# Patient Record
Sex: Male | Born: 1965 | Race: Black or African American | Hispanic: No | Smoking: Never smoker
Health system: Southern US, Community
[De-identification: ages and names within clinical notes are randomized; demographics above are authoritative.]

## PROBLEM LIST (undated history)

## (undated) DIAGNOSIS — L0390 Cellulitis, unspecified: Secondary | ICD-10-CM

## (undated) HISTORY — PX: JOINT REPLACEMENT: SHX530

## (undated) HISTORY — PX: ABDOMINAL SURGERY: SHX537

---

## 2011-10-13 ENCOUNTER — Encounter (HOSPITAL_COMMUNITY): Payer: Self-pay | Admitting: Emergency Medicine

## 2011-10-13 ENCOUNTER — Emergency Department (HOSPITAL_COMMUNITY)
Admission: EM | Admit: 2011-10-13 | Discharge: 2011-10-13 | Payer: Self-pay | Attending: Emergency Medicine | Admitting: Emergency Medicine

## 2011-10-13 ENCOUNTER — Emergency Department (HOSPITAL_COMMUNITY): Payer: Self-pay

## 2011-10-13 DIAGNOSIS — S7000XA Contusion of unspecified hip, initial encounter: Secondary | ICD-10-CM | POA: Insufficient documentation

## 2011-10-13 DIAGNOSIS — S0990XA Unspecified injury of head, initial encounter: Secondary | ICD-10-CM | POA: Insufficient documentation

## 2011-10-13 DIAGNOSIS — M79609 Pain in unspecified limb: Secondary | ICD-10-CM | POA: Insufficient documentation

## 2011-10-13 DIAGNOSIS — S7001XA Contusion of right hip, initial encounter: Secondary | ICD-10-CM
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
## 2016-04-13 DIAGNOSIS — L0390 Cellulitis, unspecified: Secondary | ICD-10-CM

## 2016-04-13 HISTORY — DX: Cellulitis, unspecified: L03.90

## 2016-04-23 ENCOUNTER — Inpatient Hospital Stay (HOSPITAL_COMMUNITY): Payer: Commercial Managed Care - PPO

## 2016-04-23 ENCOUNTER — Encounter (HOSPITAL_COMMUNITY): Payer: Self-pay | Admitting: *Deleted

## 2016-04-23 ENCOUNTER — Inpatient Hospital Stay (HOSPITAL_COMMUNITY)
Admission: EM | Admit: 2016-04-23 | Discharge: 2016-04-27 | DRG: 501 | Disposition: A | Discharge status: Home / Self Care | Payer: Commercial Managed Care - PPO | Attending: Family Medicine | Admitting: Family Medicine

## 2016-04-23 DIAGNOSIS — N179 Acute kidney failure, unspecified: Secondary | ICD-10-CM | POA: Diagnosis present

## 2016-04-23 DIAGNOSIS — M7989 Other specified soft tissue disorders: Secondary | ICD-10-CM | POA: Diagnosis not present

## 2016-04-23 DIAGNOSIS — Z87891 Personal history of nicotine dependence: Secondary | ICD-10-CM | POA: Diagnosis not present

## 2016-04-23 DIAGNOSIS — Z96659 Presence of unspecified artificial knee joint: Secondary | ICD-10-CM | POA: Diagnosis present

## 2016-04-23 DIAGNOSIS — M71121 Other infective bursitis, right elbow: Secondary | ICD-10-CM | POA: Diagnosis not present

## 2016-04-23 DIAGNOSIS — L0390 Cellulitis, unspecified: Secondary | ICD-10-CM

## 2016-04-23 DIAGNOSIS — Z8249 Family history of ischemic heart disease and other diseases of the circulatory system: Secondary | ICD-10-CM

## 2016-04-23 DIAGNOSIS — L03113 Cellulitis of right upper limb: Secondary | ICD-10-CM | POA: Diagnosis not present

## 2016-04-23 HISTORY — DX: Cellulitis, unspecified: L03.90

## 2016-04-23 LAB — CBC WITH DIFFERENTIAL/PLATELET
Basophils Absolute: 0 10*3/uL (ref 0.0–0.1)
Basophils Relative: 0 %
Eosinophils Absolute: 0.2 10*3/uL (ref 0.0–0.7)
Eosinophils Relative: 2 %
HEMATOCRIT: 38 % — AB (ref 39.0–52.0)
HEMOGLOBIN: 12.8 g/dL — AB (ref 13.0–17.0)
LYMPHS ABS: 2.2 10*3/uL (ref 0.7–4.0)
LYMPHS PCT: 23 %
MCH: 31 pg (ref 26.0–34.0)
MCHC: 33.7 g/dL (ref 30.0–36.0)
MCV: 92 fL (ref 78.0–100.0)
Monocytes Absolute: 0.8 10*3/uL (ref 0.1–1.0)
Monocytes Relative: 8 %
NEUTROS ABS: 6.2 10*3/uL (ref 1.7–7.7)
NEUTROS PCT: 67 %
Platelets: 238 10*3/uL (ref 150–400)
RBC: 4.13 MIL/uL — AB (ref 4.22–5.81)
RDW: 14.6 % (ref 11.5–15.5)
WBC: 9.4 10*3/uL (ref 4.0–10.5)

## 2016-04-23 LAB — BASIC METABOLIC PANEL
Anion gap: 10 (ref 5–15)
BUN: 12 mg/dL (ref 6–20)
CHLORIDE: 103 mmol/L (ref 101–111)
CO2: 27 mmol/L (ref 22–32)
Calcium: 9 mg/dL (ref 8.9–10.3)
Creatinine, Ser: 1.25 mg/dL — ABNORMAL HIGH (ref 0.61–1.24)
GFR calc non Af Amer: >60 mL/min (ref >60)
Glucose, Bld: 95 mg/dL (ref 65–99)
POTASSIUM: 4 mmol/L (ref 3.5–5.1)
SODIUM: 140 mmol/L (ref 135–145)

## 2016-04-23 LAB — SEDIMENTATION RATE: Sed Rate: 48 mm/hr — ABNORMAL HIGH (ref 0–16)

## 2016-04-23 LAB — C-REACTIVE PROTEIN: CRP: 6.9 mg/dL — AB (ref <1.0)

## 2016-04-23 MED ORDER — OXYCODONE-ACETAMINOPHEN 5-325 MG PO TABS
1.0000 | ORAL_TABLET | ORAL | Status: DC | PRN
  Administered 2016-04-23: 1 via ORAL

## 2016-04-23 MED ORDER — VANCOMYCIN HCL 10 G IV SOLR
1500.0000 mg | INTRAVENOUS | Status: AC
  Administered 2016-04-23: 1500 mg via INTRAVENOUS
  Filled 2016-04-23: qty 1500

## 2016-04-23 MED ORDER — SODIUM CHLORIDE 0.9 % IV SOLN
INTRAVENOUS | Status: DC
Start: 2016-04-23 — End: 2016-04-24
  Administered 2016-04-23: 18:00:00 via INTRAVENOUS

## 2016-04-23 MED ORDER — ACETAMINOPHEN 325 MG PO TABS: 650.0000 mg | ORAL_TABLET | Freq: Four times a day (QID) | ORAL | Status: DC | PRN

## 2016-04-23 MED ORDER — HYDROCODONE-ACETAMINOPHEN 5-325 MG PO TABS
1.0000 | ORAL_TABLET | ORAL | Status: DC | PRN
  Administered 2016-04-23 – 2016-04-24 (×5): 1 via ORAL
  Filled 2016-04-23 (×5): qty 1

## 2016-04-23 MED ORDER — VANCOMYCIN HCL 10 G IV SOLR
1250.0000 mg | Freq: Two times a day (BID) | INTRAVENOUS | Status: DC
  Administered 2016-04-24 – 2016-04-26 (×5): 1250 mg via INTRAVENOUS
  Filled 2016-04-23 (×7): qty 1250

## 2016-04-23 MED ORDER — ENOXAPARIN SODIUM 40 MG/0.4ML ~~LOC~~ SOLN
40.0000 mg | SUBCUTANEOUS | Status: DC
  Administered 2016-04-23 – 2016-04-26 (×4): 40 mg via SUBCUTANEOUS
  Filled 2016-04-23 (×6): qty 0.4

## 2016-04-23 MED ORDER — ONDANSETRON HCL 4 MG/2ML IJ SOLN
4.0000 mg | Freq: Four times a day (QID) | INTRAMUSCULAR | Status: DC | PRN
  Administered 2016-04-26: 4 mg via INTRAVENOUS

## 2016-04-23 MED ORDER — ONDANSETRON HCL 4 MG PO TABS: 4.0000 mg | ORAL_TABLET | Freq: Four times a day (QID) | ORAL | Status: DC | PRN

## 2016-04-23 MED ORDER — ACETAMINOPHEN 650 MG RE SUPP: 650.0000 mg | Freq: Four times a day (QID) | RECTAL | Status: DC | PRN

## 2016-04-23 MED ORDER — OXYCODONE-ACETAMINOPHEN 5-325 MG PO TABS
ORAL_TABLET | ORAL | Status: AC
  Filled 2016-04-23: qty 1

## 2016-04-23 NOTE — ED Triage Notes (Signed)
Pt is here with right arm swelling, redness, and warmth.  Went to urgent care on Saturday after developing an infected area.  He was given Decadron and Rocephin IM from the urgent care and was diagnosed with cellulitis.  Pt is now back with increased swelling and pain. He has been taking Augmentin PO.  Palpable pulse

## 2016-04-23 NOTE — Progress Notes (Signed)
ANTIBIOTIC CONSULT NOTE - INITIAL  Pharmacy Consult for Vanocmycin Indication: cellulitis  No Known Allergies  Patient Measurements: Height: 5\' 11"  (180.3 cm) Weight: 220 lb (99.8 kg) IBW/kg (Calculated) : 75.3 Adjusted Body Weight:   Vital Signs: Temp: 98.7 F (37.1 C) (12/11 1420) Temp Source: Oral (12/11 1420) BP: 144/84 (12/11 1420) Pulse Rate: 87 (12/11 1420) Intake/Output from previous day: No intake/output data recorded. Intake/Output from this shift: No intake/output data recorded.  Labs:  Recent Labs  04/23/16 1231  WBC 9.4  HGB 12.8*  PLT 238  CREATININE 1.25*   Estimated Creatinine Clearance: 87 mL/min (by C-G formula based on SCr of 1.25 mg/dL (H)). No results for input(s): VANCOTROUGH, VANCOPEAK, VANCORANDOM, GENTTROUGH, GENTPEAK, GENTRANDOM, TOBRATROUGH, TOBRAPEAK, TOBRARND, AMIKACINPEAK, AMIKACINTROU, AMIKACIN in the last 72 hours.   Microbiology: No results found for this or any previous visit (from the past 720 hour(s)).  Medical History: History reviewed. No pertinent past medical history.  Medications:  F/u med rec  Assessment: 50 y/o M presents with R arm swelling, redness, and warmth. Previously went to Urgent Care 2 days ago, was dx with cellulits, and was given Rocephin IM and Augmentin po. Labs: Scr 1.25, CrCl 87, WBC 9.4  Goal of Therapy:  Vancomycin trough level 10-15 mcg/ml  Plan:  Vancomycin 1500mg  IV x 1 then 1250mg  IV q12hrs Vancomycin trough after 3-5 doses at steady state. F/u cultures, LOT, and ability to narrow abx.  Lancelot Alyea S. Merilynn Finlandobertson, PharmD, BCPS Clinical Staff Pharmacist Pager [phone]  Misty Stanleyobertson, Taija Mathias Stillinger 04/23/2016,4:00 PM

## 2016-04-23 NOTE — H&P (Signed)
History and Physical    Carlos Yates ZOX:[id number] DOB: June 04, 1965 DOA: 04/23/2016  Referring MD/NP/PA: er PCP: Carlos FickAMACHANDRAN,AJITH, MD Outpatient Specialists:  Patient coming from: home  Chief Complaint:   HPI: Carlos Yates is a 50 y.o. male with medical history significant of knee replacement.  Works in a [employer] and was in his normal state of health until Thursday.  He noticed a "zit" on his right elbow.  It became increasing painful over Friday but was too late to go to the urgent care so he went on Saturday.  He was given IV rocephin and IV decadron x 1.  He was then given augmentin to take at home.  He was re-evaluated at the urgent care on Sunday. Pain and swelling was about the same.  Sunday PM, his pain increased and swelling increased as well.  He went to his PCP today who sent him to the ER.    In the Er, his WBC count was not elevated.  Er doc had no concerns for septic joint/osteomyelitis.  X ray pending.  Hospitalist were asked to admit for cellulitis.    Review of Systems: all systems reviewed, negative unless stated above in HPI   History reviewed. No pertinent past medical history.  Past Surgical History:  Procedure Laterality Date  . ABDOMINAL SURGERY    . JOINT REPLACEMENT     knee     reports that he has quit smoking. He has never used smokeless tobacco. He reports that he does not drink alcohol or use drugs.  No Known Allergies  Family Hx: + HTN  Prior to Admission medications   Medication Sig Start Date End Date Taking? Authorizing Provider  amoxicillin-clavulanate (AUGMENTIN) 875-125 MG tablet Take 1 tablet by mouth 2 (two) times daily.   Yes Historical Provider, MD    Physical Exam: Vitals:   04/23/16 1144 04/23/16 1221 04/23/16 1420  BP: 143/63 (!) 152/101 144/84  Pulse: 64 87 87  Resp: 18 18 14   Temp: 97.7 F (36.5 C) 98.5 F (36.9 C) 98.7 F (37.1 C)  TempSrc: Oral Oral Oral  SpO2: 100% 97% 98%  Weight: 99.8 kg (220 lb)    Height: 5\' 11"   (1.803 m)        Constitutional: NAD, calm, comfortable Vitals:   04/23/16 1144 04/23/16 1221 04/23/16 1420  BP: 143/63 (!) 152/101 144/84  Pulse: 64 87 87  Resp: 18 18 14   Temp: 97.7 F (36.5 C) 98.5 F (36.9 C) 98.7 F (37.1 C)  TempSrc: Oral Oral Oral  SpO2: 100% 97% 98%  Weight: 99.8 kg (220 lb)    Height: 5\' 11"  (1.803 m)     Eyes: PERRL, lids and conjunctivae normal ENMT: Mucous membranes are moist. Posterior pharynx clear of any exudate or lesions.Normal dentition.  Neck: normal, supple, no masses, no thyromegaly Respiratory: clear to auscultation bilaterally, no wheezing, no crackles. Normal respiratory effort. No accessory muscle use.  Cardiovascular: Regular rate and rhythm, no murmurs / rubs / gallops. 2+ pedal pulses. No carotid bruits.  Abdomen: no tenderness, no masses palpated. No hepatosplenomegaly. Bowel sounds positive.  Musculoskeletal: no clubbing / cyanosis. No joint deformity upper and lower extremities. Good ROM, no contractures. Normal muscle tone.  Skin: see photo below-- no crepitus,. + edema to wrist, some pain with flexion/extension Neurologic: CN 2-12 grossly intact. Sensation intact Psychiatric: Normal judgment and insight. Alert and oriented x 3. Normal mood.     Labs on Admission: I have personally reviewed following labs and imaging studies  CBC:  Recent Labs Lab 04/23/16 1231  WBC 9.4  NEUTROABS 6.2  HGB 12.8*  HCT 38.0*  MCV 92.0  PLT 238   Basic Metabolic Panel:  Recent Labs Lab 04/23/16 1231  NA 140  K 4.0  CL 103  CO2 27  GLUCOSE 95  BUN 12  CREATININE 1.25*  CALCIUM 9.0   GFR: Estimated Creatinine Clearance: 87 mL/min (by C-G formula based on SCr of 1.25 mg/dL (H)). Liver Function Tests: No results for input(s): AST, ALT, ALKPHOS, BILITOT, PROT, ALBUMIN in the last 168 hours. No results for input(s): LIPASE, AMYLASE in the last 168 hours. No results for input(s): AMMONIA in the last 168 hours. Coagulation  Profile: No results for input(s): INR, PROTIME in the last 168 hours. Cardiac Enzymes: No results for input(s): CKTOTAL, CKMB, CKMBINDEX, TROPONINI in the last 168 hours. BNP (last 3 results) No results for input(s): PROBNP in the last 8760 hours. HbA1C: No results for input(s): HGBA1C in the last 72 hours. CBG: No results for input(s): GLUCAP in the last 168 hours. Lipid Profile: No results for input(s): CHOL, HDL, LDLCALC, TRIG, CHOLHDL, LDLDIRECT in the last 72 hours. Thyroid Function Tests: No results for input(s): TSH, T4TOTAL, FREET4, T3FREE, THYROIDAB in the last 72 hours. Anemia Panel: No results for input(s): VITAMINB12, FOLATE, FERRITIN, TIBC, IRON, RETICCTPCT in the last 72 hours. Urine analysis: No results found for: COLORURINE, APPEARANCEUR, LABSPEC, PHURINE, GLUCOSEU, HGBUR, BILIRUBINUR, KETONESUR, PROTEINUR, UROBILINOGEN, NITRITE, LEUKOCYTESUR Sepsis Labs: Invalid input(s): PROCALCITONIN, LACTICIDVEN No results found for this or any previous visit (from the past 240 hour(s)).   Radiological Exams on Admission: No results found.    Assessment/Plan Active Problems:   Cellulitis   Cellulitis of the right elbow -IV vanc -duplex and x ray-- may need CT scan if not improving and ortho consult -mark area of redness -elevate extremitis -blood cultures -does NOT appear spetic  DVT prophylaxis: lovenox Code Status: full Family Communication: family at bedside Disposition Plan: home 3-4 days? Consults called:  Admission status: inpt- med surge   Carlos Snapp Juanetta GoslingU Latandra Loureiro DO [facility] Pager [phone]  If 7PM-7AM, please contact night-coverage [email or web address] Password [email or web address]  04/23/2016, 4:27 PM

## 2016-04-23 NOTE — Progress Notes (Signed)
Patient arrived from ED to 6n, IV fluids running, alert and oriented, moderate pain, oriented to room and staff. No complaints at this time.

## 2016-04-23 NOTE — ED Provider Notes (Signed)
[facility]-EMERGENCY DEPT Provider Note   CSN: [id number] Arrival date & time: 04/23/16  1132     History   Chief Complaint Chief Complaint  Patient presents with  . Wound Infection    right elbow and arm    HPI Carlos Yates is a 50 y.o. male.  HPI Pt comes in with cc of arm pain. Pt has no medical hx. Pt reports that he has increased arm swelling since Thursday (4 days ago), [facility] was given decadron and started on augmentin. Pt reports that the swelling has gotten worse, and so he saw his pcp who advised that pt go to the ER. Pt is immunized, immunocompetent and denies n/v/f/c/sweats.  History reviewed. No pertinent past medical history.  Patient Active Problem List   Diagnosis Date Noted  . Cellulitis 04/23/2016    Past Surgical History:  Procedure Laterality Date  . ABDOMINAL SURGERY    . JOINT REPLACEMENT     knee      Home Medications    Prior to Admission medications   Medication Sig Start Date End Date Taking? Authorizing Provider  amoxicillin-clavulanate (AUGMENTIN) 875-125 MG tablet Take 1 tablet by mouth 2 (two) times daily.   Yes Historical Provider, MD    Family History No family history on file.  Social History Social History  Substance Use Topics  . Smoking status: Former Games developermoker  . Smokeless tobacco: Never Used  . Alcohol use No     Allergies   Patient has no known allergies.   Review of Systems Review of Systems  ROS 10 Systems reviewed and are negative for acute change except as noted in the HPI.     Physical Exam Updated Vital Signs BP 144/84 (BP Location: Right Arm)   Pulse 87   Temp 98.7 F (37.1 C) (Oral)   Resp 14   Ht 5\' 11"  (1.803 m)   Wt 220 lb (99.8 kg)   SpO2 98%   BMI 30.68 kg/m   Physical Exam  Constitutional: He is oriented to person, place, and time. He appears well-developed.  HENT:  Head: Atraumatic.  Neck: Neck supple.  Cardiovascular: Normal rate.   Pulmonary/Chest: Effort normal.    Musculoskeletal:  RUE is edematous and warm to touch. ROM of the elbow is compromized due to edema more so than pain.  Neurological: He is alert and oriented to person, place, and time.  Skin: Skin is warm. Rash noted.  Nursing note and vitals reviewed.    ED Treatments / Results  Labs (all labs ordered are listed, but only abnormal results are displayed) Labs Reviewed  BASIC METABOLIC PANEL - Abnormal; Notable for the following:       Result Value   Creatinine, Ser 1.25 (*)    All other components within normal limits  CBC WITH DIFFERENTIAL/PLATELET - Abnormal; Notable for the following:    RBC 4.13 (*)    Hemoglobin 12.8 (*)    HCT 38.0 (*)    All other components within normal limits  CULTURE, BLOOD (ROUTINE X 2)  CULTURE, BLOOD (ROUTINE X 2)  C-REACTIVE PROTEIN  SEDIMENTATION RATE    EKG  EKG Interpretation None       Radiology No results found.  Procedures Procedures (including critical care time)      Medications Ordered in ED Medications  oxyCODONE-acetaminophen (PERCOCET/ROXICET) 5-325 MG per tablet 1 tablet (1 tablet Oral Given 04/23/16 1227)  oxyCODONE-acetaminophen (PERCOCET/ROXICET) 5-325 MG per tablet (not administered)  HYDROcodone-acetaminophen (NORCO/VICODIN) 5-325  MG per tablet 1 tablet (not administered)  vancomycin (VANCOCIN) 1,500 mg in sodium chloride 0.9 % 500 mL IVPB (not administered)  vancomycin (VANCOCIN) 1,250 mg in sodium chloride 0.9 % 250 mL IVPB (not administered)     Initial Impression / Assessment and Plan / ED Course  I have reviewed the triage vital signs and the nursing notes.  Pertinent labs & imaging results that were available during my care of the patient were reviewed by me and considered in my medical decision making (see chart for details).  Clinical Course    Pt's exam reveals cellulitis, that is not improving despite close f/u and outpatient antibiotics. No clinical concerns for septic joint,  ostelomyelitis. Skin is indurated, there is no crepitus. Xray to r/o gas forming organisms. Pt is immunocompetent. Admit to medicine for cellulitis.  Final Clinical Impressions(s) / ED Diagnoses   Final diagnoses:  Cellulitis of right upper extremity    New Prescriptions New Prescriptions   No medications on file     Derwood KaplanAnkit Kimm Ungaro, MD 04/23/16 1624

## 2016-04-24 ENCOUNTER — Encounter (HOSPITAL_COMMUNITY): Payer: Self-pay | Admitting: General Practice

## 2016-04-24 ENCOUNTER — Inpatient Hospital Stay (HOSPITAL_COMMUNITY): Payer: Commercial Managed Care - PPO

## 2016-04-24 DIAGNOSIS — M7989 Other specified soft tissue disorders: Secondary | ICD-10-CM

## 2016-04-24 DIAGNOSIS — N179 Acute kidney failure, unspecified: Secondary | ICD-10-CM | POA: Diagnosis present

## 2016-04-24 DIAGNOSIS — L0390 Cellulitis, unspecified: Secondary | ICD-10-CM

## 2016-04-24 DIAGNOSIS — L03113 Cellulitis of right upper limb: Secondary | ICD-10-CM | POA: Diagnosis present

## 2016-04-24 LAB — CBC
HEMATOCRIT: 37.3 % — AB (ref 39.0–52.0)
Hemoglobin: 11.9 g/dL — ABNORMAL LOW (ref 13.0–17.0)
MCH: 29.5 pg (ref 26.0–34.0)
MCHC: 31.9 g/dL (ref 30.0–36.0)
MCV: 92.6 fL (ref 78.0–100.0)
PLATELETS: 221 10*3/uL (ref 150–400)
RBC: 4.03 MIL/uL — ABNORMAL LOW (ref 4.22–5.81)
RDW: 14.7 % (ref 11.5–15.5)
WBC: 7.5 10*3/uL (ref 4.0–10.5)

## 2016-04-24 LAB — BASIC METABOLIC PANEL
Anion gap: 11 (ref 5–15)
BUN: 12 mg/dL (ref 6–20)
CO2: 24 mmol/L (ref 22–32)
CREATININE: 1.2 mg/dL (ref 0.61–1.24)
Calcium: 8.5 mg/dL — ABNORMAL LOW (ref 8.9–10.3)
Chloride: 102 mmol/L (ref 101–111)
GFR calc Af Amer: >60 mL/min (ref >60)
GLUCOSE: 112 mg/dL — AB (ref 65–99)
Potassium: 3.9 mmol/L (ref 3.5–5.1)
SODIUM: 137 mmol/L (ref 135–145)

## 2016-04-24 MED ORDER — OXYCODONE-ACETAMINOPHEN 5-325 MG PO TABS
1.0000 | ORAL_TABLET | ORAL | Status: DC | PRN
  Administered 2016-04-24 – 2016-04-27 (×14): 2 via ORAL
  Filled 2016-04-24 (×14): qty 2

## 2016-04-24 MED ORDER — SODIUM CHLORIDE 0.9 % IV SOLN
INTRAVENOUS | Status: AC
  Administered 2016-04-24: 11:00:00 via INTRAVENOUS

## 2016-04-24 NOTE — Progress Notes (Signed)
Patient ID: Carlos Yates, male   DOB: 15-Jun-1965, 50 y.o.   MRN: [id number]  PROGRESS NOTE    Carlos Yates  MVH:[id number] DOB: 15-Jun-1965 DOA: 04/23/2016  PCP: Georgianne FickAMACHANDRAN,AJITH, MD   Brief Narrative:  50 y.o. male with medical history significant of knee replacement. [facility], swelling of his right elbow. Patient reports noticing a small bump which became increasingly painful over past few days prior to this admission. He went to urgent care and he was given Rocephin and Decadron. He was also given Augmentin and then reevaluated the following day in urgent care. There was no significant improvement since he went to see his PCP who recommended going to ER for evaluation. He was subsequently found to have soft tissue edema, cellulitis based on x-rays. He was started on vancomycin.   Assessment & Plan:   Principal Problem:   Right arm cellulitis - No evidence of acute fracture or osteomyelitis on x-ray. Soft tissue edema, cellulitis seen on x-ray - Patient still has significant swelling and redness in right arm - Continue vancomycin for now   Active Problems:   Acute kidney injury (HCC) - Unclear etiology but with IV fluids it has subsequently normalized    DVT prophylaxis: Lovenox subcutaneous Code Status: full code  Family Communication: No family at the bedside Disposition Plan: Home once his cellulitis improves anticipated next 2-3 days   Consultants:   None   Procedures:   None   Antimicrobials:   Vanco 04/23/2016 -->    Subjective: No overnight events.   Objective: Vitals:   04/23/16 1600 04/23/16 1800 04/23/16 1936 04/24/16 0608  BP: 114/68 (!) 121/91 (!) 141/85 (!) 144/92  Pulse: 100 86 91 82  Resp: 16 16 16 17   Temp:  98.8 F (37.1 C) 99.1 F (37.3 C) 97.9 F (36.6 C)  TempSrc:  Oral Oral Oral  SpO2: 96% 100% 94% 92%  Weight:      Height:        Intake/Output Summary (Last 24 hours) at 04/24/16  1018 Last data filed at 04/24/16 0840  Gross per 24 hour  Intake          1698.75 ml  Output                0 ml  Net          1698.75 ml   Filed Weights   04/23/16 1144  Weight: 99.8 kg (220 lb)    Examination:  General exam: Appears calm and comfortable  Respiratory system: Clear to auscultation. Respiratory effort normal. Cardiovascular system: S1 & S2 heard, RRR. No pedal edema. Gastrointestinal system: Abdomen is nondistended, soft and nontender. No organomegaly or masses felt. Normal bowel sounds heard. Central nervous system: Alert and oriented. No focal neurological deficits. Extremities: right arm red, swollen, warm to palpation, palpable pulses  Skin: No rashes, lesions or ulcers Psychiatry: Judgement and insight appear normal. Mood & affect appropriate.   Data Reviewed: I have personally reviewed following labs and imaging studies  CBC:  Recent Labs Lab 04/23/16 1231 04/24/16 0528  WBC 9.4 7.5  NEUTROABS 6.2  --   HGB 12.8* 11.9*  HCT 38.0* 37.3*  MCV 92.0 92.6  PLT 238 221   Basic Metabolic Panel:  Recent Labs Lab 04/23/16 1231 04/24/16 0528  NA 140 137  K 4.0 3.9  CL 103 102  CO2 27 24  GLUCOSE 95 112*  BUN 12 12  CREATININE 1.25* 1.20  CALCIUM  9.0 8.5*   GFR: Estimated Creatinine Clearance: 90.6 mL/min (by C-G formula based on SCr of 1.2 mg/dL). Liver Function Tests: No results for input(s): AST, ALT, ALKPHOS, BILITOT, PROT, ALBUMIN in the last 168 hours. No results for input(s): LIPASE, AMYLASE in the last 168 hours. No results for input(s): AMMONIA in the last 168 hours. Coagulation Profile: No results for input(s): INR, PROTIME in the last 168 hours. Cardiac Enzymes: No results for input(s): CKTOTAL, CKMB, CKMBINDEX, TROPONINI in the last 168 hours. BNP (last 3 results) No results for input(s): PROBNP in the last 8760 hours. HbA1C: No results for input(s): HGBA1C in the last 72 hours. CBG: No results for input(s): GLUCAP in the last  168 hours. Lipid Profile: No results for input(s): CHOL, HDL, LDLCALC, TRIG, CHOLHDL, LDLDIRECT in the last 72 hours. Thyroid Function Tests: No results for input(s): TSH, T4TOTAL, FREET4, T3FREE, THYROIDAB in the last 72 hours. Anemia Panel: No results for input(s): VITAMINB12, FOLATE, FERRITIN, TIBC, IRON, RETICCTPCT in the last 72 hours. Urine analysis: No results found for: COLORURINE, APPEARANCEUR, LABSPEC, PHURINE, GLUCOSEU, HGBUR, BILIRUBINUR, KETONESUR, PROTEINUR, UROBILINOGEN, NITRITE, LEUKOCYTESUR Sepsis Labs: @LABRCNTIP (procalcitonin:4,lacticidven:4)   )No results found for this or any previous visit (from the past 240 hour(s)).    Radiology Studies: Dg Forearm Right Result Date: 04/23/2016  1. No fracture, bone lesion or evidence of osteomyelitis. 2. Soft tissue cellulitis and/or edema.  No soft tissue air. Electronically Signed   By: Amie Portlandavid  Ormond M.D.   On: 04/23/2016 17:11     Scheduled Meds: . enoxaparin (LOVENOX) injection  40 mg Subcutaneous Q24H  . vancomycin  1,250 mg Intravenous Q12H   Continuous Infusions: . sodium chloride 75 mL/hr at 04/23/16 1819     LOS: 1 day    Time spent: 15 minutes  Greater than 50% of the time spent on counseling and coordinating the care.   Manson PasseyEVINE, Valoria Tamburri, MD [facility] Pager [phone]  If 7PM-7AM, please contact night-coverage [email or web address] Password [email or web address] 04/24/2016, 10:18 AM

## 2016-04-24 NOTE — Progress Notes (Signed)
*  PRELIMINARY RESULTS* Vascular Ultrasound Right upper extremity venous duplex has been completed.  Preliminary findings: No evidence of DVT or superficial thrombosis.  Farrel DemarkJill Eunice, RDMS, RVT  04/24/2016, 1:28 PM

## 2016-04-25 ENCOUNTER — Inpatient Hospital Stay (HOSPITAL_COMMUNITY): Payer: Commercial Managed Care - PPO

## 2016-04-25 DIAGNOSIS — M71121 Other infective bursitis, right elbow: Secondary | ICD-10-CM | POA: Diagnosis present

## 2016-04-25 DIAGNOSIS — N179 Acute kidney failure, unspecified: Secondary | ICD-10-CM

## 2016-04-25 DIAGNOSIS — L03113 Cellulitis of right upper limb: Secondary | ICD-10-CM

## 2016-04-25 LAB — BASIC METABOLIC PANEL WITH GFR
Anion gap: 8 (ref 5–15)
BUN: 12 mg/dL (ref 6–20)
CO2: 26 mmol/L (ref 22–32)
Calcium: 9.1 mg/dL (ref 8.9–10.3)
Chloride: 105 mmol/L (ref 101–111)
Creatinine, Ser: 1.2 mg/dL (ref 0.61–1.24)
GFR calc Af Amer: >60 mL/min
GFR calc non Af Amer: >60 mL/min
Glucose, Bld: 112 mg/dL — ABNORMAL HIGH (ref 65–99)
Potassium: 4.3 mmol/L (ref 3.5–5.1)
Sodium: 139 mmol/L (ref 135–145)

## 2016-04-25 LAB — CBC
HEMATOCRIT: 37.8 % — AB (ref 39.0–52.0)
HEMOGLOBIN: 12.4 g/dL — AB (ref 13.0–17.0)
MCH: 30.5 pg (ref 26.0–34.0)
MCHC: 32.8 g/dL (ref 30.0–36.0)
MCV: 93.1 fL (ref 78.0–100.0)
Platelets: 233 10*3/uL (ref 150–400)
RBC: 4.06 MIL/uL — ABNORMAL LOW (ref 4.22–5.81)
RDW: 14.6 % (ref 11.5–15.5)
WBC: 7.1 10*3/uL (ref 4.0–10.5)

## 2016-04-25 MED ORDER — IOPAMIDOL (ISOVUE-300) INJECTION 61%
INTRAVENOUS | Status: AC
  Administered 2016-04-25: 100 mL
  Filled 2016-04-25: qty 100

## 2016-04-25 MED ORDER — POVIDONE-IODINE 7.5 % EX SOLN
Freq: Once | CUTANEOUS | Status: AC
  Administered 2016-04-26: 05:00:00 via TOPICAL
  Filled 2016-04-25: qty 118

## 2016-04-25 MED ORDER — CEFAZOLIN SODIUM-DEXTROSE 2-4 GM/100ML-% IV SOLN
2.0000 g | INTRAVENOUS | Status: DC
  Filled 2016-04-25: qty 100

## 2016-04-25 MED ORDER — SODIUM CHLORIDE 0.9 % IV SOLN
INTRAVENOUS | Status: DC
  Administered 2016-04-25: 17:00:00 via INTRAVENOUS

## 2016-04-25 NOTE — Anesthesia Preprocedure Evaluation (Addendum)
Anesthesia Evaluation  Patient identified by MRN, date of birth, ID band Patient awake    Reviewed: Allergy & Precautions, H&P , Patient's Chart, lab work & pertinent test results, reviewed documented beta blocker date and time   Airway Mallampati: II  TM Distance: >3 FB Neck ROM: full    Dental no notable dental hx.    Pulmonary    Pulmonary exam normal breath sounds clear to auscultation       Cardiovascular  Rhythm:regular Rate:Normal     Neuro/Psych    GI/Hepatic   Endo/Other    Renal/GU      Musculoskeletal   Abdominal   Peds  Hematology   Anesthesia Other Findings   Reproductive/Obstetrics                             Anesthesia Physical Anesthesia Plan  ASA: II  Anesthesia Plan: General   Post-op Pain Management:    Induction: Intravenous  Airway Management Planned: LMA  Additional Equipment:   Intra-op Plan:   Post-operative Plan:   Informed Consent: I have reviewed the patients History and Physical, chart, labs and discussed the procedure including the risks, benefits and alternatives for the proposed anesthesia with the patient or authorized representative who has indicated his/her understanding and acceptance.   Dental Advisory Given and Dental advisory given  Plan Discussed with: CRNA and Surgeon  Anesthesia Plan Comments: (Discussed GA with LMA, possible sore throat, potential need to switch to ETT, N/V, pulmonary aspiration. Questions answered. )        Anesthesia Quick Evaluation  

## 2016-04-25 NOTE — Consult Note (Signed)
ORTHOPAEDIC CONSULTATION  REQUESTING PHYSICIAN: Filbert SchilderAlexandria U Kadolph, MD  Chief Complaint: Right elbow pain  Assessment / Plan: Principal Problem:   Right arm cellulitis Active Problems:   Acute kidney injury (HCC)  Septic olecranon bursitis.   I&D with possible drain placement recommended and planned for tomorrow morning.   Nothing by mouth after midnight Weight Bearing Status: Wbat  Ice and elevate for comfort VTE px: SCD's, mobilize, and lovenox.    The risks benefits and alternatives of the procedure were discussed with the patient. The patient verbalizes understanding and wishes to proceed.    HPI: Carlos Yates is a 50 y.o. male who complains of right elbow pain increasing since last Thursday. Initially appeared as a small bump and became increasingly red, swollen, and painful. He went to urgent care and received IV Rocephin and by mouth Augmentin. He notes fever on Friday. It did not significantly improve over the weekend so he presented to his PCP who referred him to the ED. Since admission he's had minimal improvement now on IV antibiotics.  Orthopedics was consulted for suspected infected olecranon bursitis.  Past Medical History:  Diagnosis Date  . Cellulitis 04/2016   RT ELBOW    Past Surgical History:  Procedure Laterality Date  . ABDOMINAL SURGERY    . JOINT REPLACEMENT     knee   Social History   Social History  . Marital status: Unknown    Spouse name: N/A  . Number of children: N/A  . Years of education: N/A   Social History Main Topics  . Smoking status: Never Smoker  . Smokeless tobacco: Never Used  . Alcohol use No  . Drug use: No  . Sexual activity: Not Asked   Other Topics Concern  . None   Social History Narrative  . None   History reviewed. No pertinent family history. No Known Allergies Prior to Admission medications   Medication Sig Start Date End Date Taking? Authorizing Provider  amoxicillin-clavulanate (AUGMENTIN) 875-125  MG tablet Take 1 tablet by mouth 2 (two) times daily.   Yes Historical Provider, MD   Ct Extrem Up Entire Arm R W/cm  Result Date: 04/25/2016 CLINICAL DATA:  Cellulitis. EXAM: CT OF THE UPPER RIGHT EXTREMITY WITH CONTRAST TECHNIQUE: Multidetector CT imaging of the upper right extremity was performed according to the standard protocol following intravenous contrast administration. COMPARISON:  Forearm radiographs dated 04/23/2016 low CONTRAST:  100mL ISOVUE-300 IOPAMIDOL (ISOVUE-300) INJECTION 61% FINDINGS: Bones/Joint/Cartilage Negative. No joint effusion. Ligaments Suboptimally assessed by CT. Muscles and Tendons Negative Soft tissues There is fluid distending the olecranon bursa with soft tissue edema in the adjacent subcutaneous fat extending proximally and distally from the bursa. The edema extends 2/3 of the way down the forearm. IMPRESSION: Findings consistent with olecranon bursitis. Given the patient's clinical history, the finding is worrisome for infected olecranon bursitis. Electronically Signed   By: Francene BoyersJames  Maxwell M.D.   On: 04/25/2016 16:21    Positive ROS: All other systems have been reviewed and were otherwise negative with the exception of those mentioned in the HPI and as above.  Objective: Labs cbc  Recent Labs  04/24/16 0528 04/25/16 0712  WBC 7.5 7.1  HGB 11.9* 12.4*  HCT 37.3* 37.8*  PLT 221 233    Labs inflam  Recent Labs  04/23/16 1636  CRP 6.9*    Labs coag No results for input(s): INR, PTT in the last 72 hours.  Invalid input(s): PT   Recent Labs  04/24/16  0528 04/25/16 0827  NA 137 139  K 3.9 4.3  CL 102 105  CO2 24 26  GLUCOSE 112* 112*  BUN 12 12  CREATININE 1.20 1.20  CALCIUM 8.5* 9.1    Physical Exam: Vitals:   04/25/16 0138 04/25/16 0524  BP: (!) 145/88 (!) 139/96  Pulse: 76 73  Resp: 17 18  Temp: 99.1 F (37.3 C) 97.8 F (36.6 C)   General: Alert, no acute distress Mental status: Alert and Oriented x3 Neurologic: Speech  Clear and organized, no gross focal findings or movement disorder appreciated. Respiratory: No cyanosis, no use of accessory musculature Cardiovascular: No pedal edema GI: Abdomen is soft and non-tender, non-distended. Skin: Warm and dry.   Extremities: Warm and well perfused w/o edema Psychiatric: Patient is competent for consent with normal mood and affect  MUSCULOSKELETAL:  Right elbow with significant swelling, warmth, and erythema. Right olecranon with bogginess/area of fluctuance. No open wound/lesion or drainage. Nearly full, apparently painless range of motion and use of right arm. Neurovascular intact distally. Distal sensation intact. Full use of right hand. Other extremities are atraumatic with painless ROM and NVI.   Albina BilletHenry Calvin Martensen III PA-C 04/25/2016 7:28 PM

## 2016-04-25 NOTE — Progress Notes (Addendum)
PROGRESS NOTE    Carlos Yates  ONG:[id number] DOB: [birth year] DOA: 04/23/2016 PCP: Georgianne Fick, MD    Brief Narrative:  50 y.o.malewith medical history significant of knee replacement. Patient presented to [facility] with worsening pain, swelling of his right elbow. Patient reports noticing a small bump which became increasingly painful over past few days prior to this admission. He went to urgent care and he was given Rocephin and Decadron. He was also given Augmentin and then reevaluated the following day in urgent care. There was no significant improvement since he went to see his PCP who recommended going to ER for evaluation. He was subsequently found to have soft tissue edema, cellulitis based on x-rays. He was started on vancomycin.   Assessment & Plan:   Principal Problem:   Right arm cellulitis Active Problems:   Acute kidney injury (HCC)   Right arm cellulitis - No evidence of acute fracture or osteomyelitis on x-ray. Soft tissue edema, cellulitis seen on x-ray - Patient still has significant swelling, tenderness and redness in right arm and states he does not feel as though the arm has gotten any better - Continue vancomycin for now - will order CT scan and pending results may need to consult orthopedics - not septic   Active Problems:   Acute kidney injury (HCC) - Unclear etiology but with IV fluids it has subsequently normalized - will repeat BMP in am after IV contrast - will give 1L NS at 164ml/hr    DVT prophylaxis: lovenox Code Status: Full code Family Communication: No family bedside Disposition Plan: pending improvement in cellulitis   Consultants:   None  Procedures:   None  Antimicrobials:   Vancomycin 12/11>    Subjective: Patient seen and examined.  States that he feels as though his arm has not had much improvement since admission.  Still experiencing some limitations in ROM secondary to pain and to swelling. Says pain is  slightly better controlled today.  Objective: Vitals:   04/24/16 2121 04/24/16 2124 04/25/16 0138 04/25/16 0524  BP: (!) 151/93 118/86 (!) 145/88 (!) 139/96  Pulse: 84  76 73  Resp: 17  17 18   Temp: 99 F (37.2 C)  99.1 F (37.3 C) 97.8 F (36.6 C)  TempSrc: Oral  Oral Oral  SpO2: 92%  100% 97%  Weight:      Height:        Intake/Output Summary (Last 24 hours) at 04/25/16 1522 Last data filed at 04/25/16 0600  Gross per 24 hour  Intake              730 ml  Output              801 ml  Net              -71 ml   Filed Weights   04/23/16 1144  Weight: 99.8 kg (220 lb)    Examination:  General exam: Appears calm and comfortable  Respiratory system: Clear to auscultation. Respiratory effort normal. Cardiovascular system: S1 & S2 heard, RRR. No JVD, murmurs, rubs, gallops or clicks. No pedal edema. Gastrointestinal system: Abdomen is nondistended, soft and nontender. No organomegaly or masses felt. Normal bowel sounds heard. Central nervous system: Alert and oriented. No focal neurological deficits. Extremities: decrease in power in the right upper extremity at elbow and wrist, swelling of right elbow present Skin: erythema of the right elbow present Psychiatry: Judgement and insight appear normal. Mood & affect appropriate.     Data  Reviewed: I have personally reviewed following labs and imaging studies  CBC:  Recent Labs Lab 04/23/16 1231 04/24/16 0528 04/25/16 0712  WBC 9.4 7.5 7.1  NEUTROABS 6.2  --   --   HGB 12.8* 11.9* 12.4*  HCT 38.0* 37.3* 37.8*  MCV 92.0 92.6 93.1  PLT 238 221 233   Basic Metabolic Panel:  Recent Labs Lab 04/23/16 1231 04/24/16 0528 04/25/16 0827  NA 140 137 139  K 4.0 3.9 4.3  CL 103 102 105  CO2 27 24 26   GLUCOSE 95 112* 112*  BUN 12 12 12   CREATININE 1.25* 1.20 1.20  CALCIUM 9.0 8.5* 9.1   GFR: Estimated Creatinine Clearance: 90.6 mL/min (by C-G formula based on SCr of 1.2 mg/dL). Liver Function Tests: No results for  input(s): AST, ALT, ALKPHOS, BILITOT, PROT, ALBUMIN in the last 168 hours. No results for input(s): LIPASE, AMYLASE in the last 168 hours. No results for input(s): AMMONIA in the last 168 hours. Coagulation Profile: No results for input(s): INR, PROTIME in the last 168 hours. Cardiac Enzymes: No results for input(s): CKTOTAL, CKMB, CKMBINDEX, TROPONINI in the last 168 hours. BNP (last 3 results) No results for input(s): PROBNP in the last 8760 hours. HbA1C: No results for input(s): HGBA1C in the last 72 hours. CBG: No results for input(s): GLUCAP in the last 168 hours. Lipid Profile: No results for input(s): CHOL, HDL, LDLCALC, TRIG, CHOLHDL, LDLDIRECT in the last 72 hours. Thyroid Function Tests: No results for input(s): TSH, T4TOTAL, FREET4, T3FREE, THYROIDAB in the last 72 hours. Anemia Panel: No results for input(s): VITAMINB12, FOLATE, FERRITIN, TIBC, IRON, RETICCTPCT in the last 72 hours. Sepsis Labs: No results for input(s): PROCALCITON, LATICACIDVEN in the last 168 hours.  Recent Results (from the past 240 hour(s))  Culture, blood (Routine X 2) w Reflex to ID Panel     Status: None (Preliminary result)   Collection Time: 04/23/16  4:36 PM  Result Value Ref Range Status   Specimen Description BLOOD RIGHT FOREARM  Final   Special Requests BOTTLES DRAWN AEROBIC AND ANAEROBIC 5ML  Final   Culture NO GROWTH < 24 HOURS  Final   Report Status PENDING  Incomplete  Culture, blood (Routine X 2) w Reflex to ID Panel     Status: None (Preliminary result)   Collection Time: 04/23/16  6:40 PM  Result Value Ref Range Status   Specimen Description BLOOD LEFT HAND  Final   Special Requests BOTTLES DRAWN AEROBIC AND ANAEROBIC 5CC  Final   Culture NO GROWTH < 24 HOURS  Final   Report Status PENDING  Incomplete         Radiology Studies: Dg Forearm Right  Result Date: 04/23/2016 CLINICAL DATA:  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 He noticed a  "zit" on his right elbow. It became increasing painful over Friday but was too late to go to the urgent care so he went on Saturday. He was given IV rocephin and IV decadron x 1. He was then given augmentin to take at home. He was re-evaluated at the urgent care on Sunday. Pain and swelling was about the same. Sunday PM, his pain increased and swelling increased as well. He went to his PCP today who sent him to the ER EXAM: RIGHT FOREARM - 2 VIEW COMPARISON:  None. FINDINGS: No fracture.  No bone lesion.  No evidence of osteomyelitis. Wrist and elbow joints are normally aligned. There is subcutaneous soft tissue  edema/cellulitis which predominates dorsally. No soft tissue air or radiopaque foreign body. IMPRESSION: 1. No fracture, bone lesion or evidence of osteomyelitis. 2. Soft tissue cellulitis and/or edema.  No soft tissue air. Electronically Signed   By: Amie Portlandavid  Ormond M.D.   On: 04/23/2016 17:11        Scheduled Meds: . enoxaparin (LOVENOX) injection  40 mg Subcutaneous Q24H  . vancomycin  1,250 mg Intravenous Q12H   Continuous Infusions:   LOS: 2 days    Time spent: 30 minutes  Addendum: CT scan showed likely infected olecranon bursitis- will consult Orthopedic Surgery   Katrinka BlazingAlex U Kadolph, MD [facility] Pager [phone]  If 7PM-7AM, please contact night-coverage [email or web address] Password [email or web address] 04/25/2016, 3:22 PM

## 2016-04-26 ENCOUNTER — Inpatient Hospital Stay (HOSPITAL_COMMUNITY): Payer: Commercial Managed Care - PPO | Admitting: Anesthesiology

## 2016-04-26 ENCOUNTER — Encounter (HOSPITAL_COMMUNITY): Payer: Self-pay | Admitting: Critical Care Medicine

## 2016-04-26 ENCOUNTER — Encounter (HOSPITAL_COMMUNITY)
Admission: EM | Disposition: A | Discharge status: Home / Self Care | Payer: Self-pay | Source: Home / Self Care | Attending: Family Medicine

## 2016-04-26 DIAGNOSIS — M71121 Other infective bursitis, right elbow: Principal | ICD-10-CM

## 2016-04-26 HISTORY — PX: I & D EXTREMITY: SHX5045

## 2016-04-26 LAB — BASIC METABOLIC PANEL
ANION GAP: 9 (ref 5–15)
BUN: 12 mg/dL (ref 6–20)
CALCIUM: 9 mg/dL (ref 8.9–10.3)
CO2: 27 mmol/L (ref 22–32)
Chloride: 102 mmol/L (ref 101–111)
Creatinine, Ser: 1.19 mg/dL (ref 0.61–1.24)
GFR calc Af Amer: >60 mL/min (ref >60)
GFR calc non Af Amer: >60 mL/min (ref >60)
GLUCOSE: 107 mg/dL — AB (ref 65–99)
POTASSIUM: 4.5 mmol/L (ref 3.5–5.1)
Sodium: 138 mmol/L (ref 135–145)

## 2016-04-26 LAB — CBC
HEMATOCRIT: 38.4 % — AB (ref 39.0–52.0)
HEMOGLOBIN: 12.3 g/dL — AB (ref 13.0–17.0)
MCH: 30.1 pg (ref 26.0–34.0)
MCHC: 32 g/dL (ref 30.0–36.0)
MCV: 93.9 fL (ref 78.0–100.0)
Platelets: 246 10*3/uL (ref 150–400)
RBC: 4.09 MIL/uL — AB (ref 4.22–5.81)
RDW: 14.6 % (ref 11.5–15.5)
WBC: 7.3 10*3/uL (ref 4.0–10.5)

## 2016-04-26 LAB — SURGICAL PCR SCREEN
MRSA, PCR: NEGATIVE
STAPHYLOCOCCUS AUREUS: NEGATIVE

## 2016-04-26 SURGERY — IRRIGATION AND DEBRIDEMENT EXTREMITY
Anesthesia: General | Site: Elbow | Laterality: Right

## 2016-04-26 MED ORDER — KETOROLAC TROMETHAMINE 15 MG/ML IJ SOLN
15.0000 mg | Freq: Four times a day (QID) | INTRAMUSCULAR | Status: AC
  Administered 2016-04-26 – 2016-04-27 (×4): 15 mg via INTRAVENOUS
  Filled 2016-04-26 (×3): qty 1

## 2016-04-26 MED ORDER — OXYCODONE-ACETAMINOPHEN 5-325 MG PO TABS: 1.0000 | ORAL_TABLET | ORAL | 0 refills | Status: AC | PRN

## 2016-04-26 MED ORDER — METOCLOPRAMIDE HCL 5 MG PO TABS: 5.0000 mg | ORAL_TABLET | Freq: Three times a day (TID) | ORAL | Status: DC | PRN

## 2016-04-26 MED ORDER — LACTATED RINGERS IV SOLN: INTRAVENOUS | Status: DC

## 2016-04-26 MED ORDER — CEFAZOLIN SODIUM 1 G IJ SOLR
INTRAMUSCULAR | Status: DC | PRN
  Administered 2016-04-26: 2 g via INTRAMUSCULAR

## 2016-04-26 MED ORDER — BUPIVACAINE HCL (PF) 0.5 % IJ SOLN
INTRAMUSCULAR | Status: DC | PRN
  Administered 2016-04-26: 20 mL

## 2016-04-26 MED ORDER — CEPHALEXIN 500 MG PO CAPS: 500.0000 mg | ORAL_CAPSULE | Freq: Three times a day (TID) | ORAL | 0 refills | Status: AC

## 2016-04-26 MED ORDER — MIDAZOLAM HCL 2 MG/2ML IJ SOLN
INTRAMUSCULAR | Status: AC
  Filled 2016-04-26: qty 2

## 2016-04-26 MED ORDER — LACTATED RINGERS IV SOLN
INTRAVENOUS | Status: DC | PRN
  Administered 2016-04-26: 07:00:00 via INTRAVENOUS

## 2016-04-26 MED ORDER — METOCLOPRAMIDE HCL 5 MG/ML IJ SOLN: 5.0000 mg | Freq: Three times a day (TID) | INTRAMUSCULAR | Status: DC | PRN

## 2016-04-26 MED ORDER — FENTANYL CITRATE (PF) 100 MCG/2ML IJ SOLN
INTRAMUSCULAR | Status: AC
  Filled 2016-04-26: qty 2

## 2016-04-26 MED ORDER — LIDOCAINE 2% (20 MG/ML) 5 ML SYRINGE
INTRAMUSCULAR | Status: AC
  Filled 2016-04-26: qty 5

## 2016-04-26 MED ORDER — METHOCARBAMOL 500 MG PO TABS
500.0000 mg | ORAL_TABLET | Freq: Four times a day (QID) | ORAL | Status: DC | PRN
  Administered 2016-04-26 (×2): 500 mg via ORAL
  Filled 2016-04-26: qty 1

## 2016-04-26 MED ORDER — LIDOCAINE HCL (CARDIAC) 10 MG/ML IV SOLN
INTRAVENOUS | Status: DC | PRN
  Administered 2016-04-26: 100 mg via INTRAVENOUS

## 2016-04-26 MED ORDER — FENTANYL CITRATE (PF) 100 MCG/2ML IJ SOLN
INTRAMUSCULAR | Status: DC | PRN
  Administered 2016-04-26: 100 ug via INTRAVENOUS

## 2016-04-26 MED ORDER — DOCUSATE SODIUM 100 MG PO CAPS: 100.0000 mg | ORAL_CAPSULE | Freq: Two times a day (BID) | ORAL | 0 refills | Status: AC

## 2016-04-26 MED ORDER — DOXYCYCLINE HYCLATE 50 MG PO CAPS: 100.0000 mg | ORAL_CAPSULE | Freq: Two times a day (BID) | ORAL | 0 refills | Status: AC

## 2016-04-26 MED ORDER — ONDANSETRON HCL 4 MG PO TABS: 4.0000 mg | ORAL_TABLET | Freq: Three times a day (TID) | ORAL | 0 refills | Status: AC | PRN

## 2016-04-26 MED ORDER — HYDROMORPHONE HCL 1 MG/ML IJ SOLN
0.2500 mg | INTRAMUSCULAR | Status: DC | PRN
  Administered 2016-04-26 (×2): 0.5 mg via INTRAVENOUS

## 2016-04-26 MED ORDER — PROPOFOL 10 MG/ML IV BOLUS
INTRAVENOUS | Status: DC | PRN
  Administered 2016-04-26 (×2): 50 mg via INTRAVENOUS
  Administered 2016-04-26: 250 mg via INTRAVENOUS

## 2016-04-26 MED ORDER — METHOCARBAMOL 500 MG PO TABS: 500.0000 mg | ORAL_TABLET | Freq: Four times a day (QID) | ORAL | 0 refills | Status: AC | PRN

## 2016-04-26 MED ORDER — SODIUM CHLORIDE 0.9 % IR SOLN
Status: DC | PRN
  Administered 2016-04-26: 4000 mL

## 2016-04-26 MED ORDER — ONDANSETRON HCL 4 MG/2ML IJ SOLN
INTRAMUSCULAR | Status: AC
  Filled 2016-04-26: qty 2

## 2016-04-26 MED ORDER — MIDAZOLAM HCL 5 MG/5ML IJ SOLN
INTRAMUSCULAR | Status: DC | PRN
  Administered 2016-04-26: 2 mg via INTRAVENOUS

## 2016-04-26 MED ORDER — DOCUSATE SODIUM 100 MG PO CAPS
100.0000 mg | ORAL_CAPSULE | Freq: Two times a day (BID) | ORAL | Status: DC
  Administered 2016-04-26 – 2016-04-27 (×2): 100 mg via ORAL
  Filled 2016-04-26 (×2): qty 1

## 2016-04-26 MED ORDER — METHOCARBAMOL 500 MG PO TABS
ORAL_TABLET | ORAL | Status: AC
  Filled 2016-04-26: qty 1

## 2016-04-26 MED ORDER — PROPOFOL 10 MG/ML IV BOLUS
INTRAVENOUS | Status: AC
  Filled 2016-04-26: qty 20

## 2016-04-26 MED ORDER — KETOROLAC TROMETHAMINE 15 MG/ML IJ SOLN
INTRAMUSCULAR | Status: AC
  Filled 2016-04-26: qty 1

## 2016-04-26 MED ORDER — MORPHINE SULFATE (PF) 2 MG/ML IV SOLN: 2.0000 mg | INTRAVENOUS | Status: DC | PRN

## 2016-04-26 MED ORDER — HYDROMORPHONE HCL 1 MG/ML IJ SOLN
INTRAMUSCULAR | Status: AC
  Filled 2016-04-26: qty 0.5

## 2016-04-26 MED ORDER — METHOCARBAMOL 1000 MG/10ML IJ SOLN: 500.0000 mg | Freq: Four times a day (QID) | INTRAMUSCULAR | Status: DC | PRN

## 2016-04-26 MED ORDER — BUPIVACAINE HCL (PF) 0.5 % IJ SOLN
INTRAMUSCULAR | Status: AC
  Filled 2016-04-26: qty 30

## 2016-04-26 MED ORDER — DICLOXACILLIN SODIUM 500 MG PO CAPS
500.0000 mg | ORAL_CAPSULE | Freq: Four times a day (QID) | ORAL | Status: DC
  Administered 2016-04-26 – 2016-04-27 (×3): 500 mg via ORAL
  Filled 2016-04-26 (×5): qty 1

## 2016-04-26 SURGICAL SUPPLY — 51 items
BANDAGE ACE 4X5 VEL STRL LF (GAUZE/BANDAGES/DRESSINGS) ×2 IMPLANT
BANDAGE ACE 6X5 VEL STRL LF (GAUZE/BANDAGES/DRESSINGS) IMPLANT
BANDAGE ESMARK 6X9 LF (GAUZE/BANDAGES/DRESSINGS) IMPLANT
BLADE SURG 10 STRL SS (BLADE) IMPLANT
BNDG COHESIVE 4X5 TAN STRL (GAUZE/BANDAGES/DRESSINGS) IMPLANT
BNDG ESMARK 4X9 LF (GAUZE/BANDAGES/DRESSINGS) IMPLANT
BNDG ESMARK 6X9 LF (GAUZE/BANDAGES/DRESSINGS)
BNDG GAUZE ELAST 4 BULKY (GAUZE/BANDAGES/DRESSINGS) ×2 IMPLANT
CONT SPEC 4OZ CLIKSEAL STRL BL (MISCELLANEOUS) IMPLANT
COVER SURGICAL LIGHT HANDLE (MISCELLANEOUS) ×2 IMPLANT
CUFF TOURN SGL LL 12 NO SLV (MISCELLANEOUS) IMPLANT
CUFF TOURNIQUET SINGLE 34IN LL (TOURNIQUET CUFF) IMPLANT
DRAPE SURG 17X23 STRL (DRAPES) IMPLANT
DRAPE U-SHAPE 47X51 STRL (DRAPES) IMPLANT
DRSG ADAPTIC 3X8 NADH LF (GAUZE/BANDAGES/DRESSINGS) ×2 IMPLANT
DRSG PAD ABDOMINAL 8X10 ST (GAUZE/BANDAGES/DRESSINGS) ×4 IMPLANT
DURAPREP 26ML APPLICATOR (WOUND CARE) ×2 IMPLANT
ELECT REM PT RETURN 9FT ADLT (ELECTROSURGICAL) ×2
ELECTRODE REM PT RTRN 9FT ADLT (ELECTROSURGICAL) ×1 IMPLANT
EVACUATOR 1/8 PVC DRAIN (DRAIN) IMPLANT
FACESHIELD WRAPAROUND (MASK) ×4 IMPLANT
GAUZE SPONGE 4X4 12PLY STRL (GAUZE/BANDAGES/DRESSINGS) ×2 IMPLANT
GAUZE XEROFORM 1X8 LF (GAUZE/BANDAGES/DRESSINGS) IMPLANT
GLOVE BIO SURGEON STRL SZ7.5 (GLOVE) ×4 IMPLANT
GLOVE BIOGEL PI IND STRL 8 (GLOVE) ×2 IMPLANT
GLOVE BIOGEL PI INDICATOR 8 (GLOVE) ×2
GOWN STRL REUS W/ TWL LRG LVL3 (GOWN DISPOSABLE) ×3 IMPLANT
GOWN STRL REUS W/TWL LRG LVL3 (GOWN DISPOSABLE) ×3
HANDPIECE INTERPULSE COAX TIP (DISPOSABLE)
KIT BASIN OR (CUSTOM PROCEDURE TRAY) ×2 IMPLANT
KIT ROOM TURNOVER OR (KITS) ×2 IMPLANT
MANIFOLD NEPTUNE II (INSTRUMENTS) ×2 IMPLANT
NEEDLE 25GAX1.5 (MISCELLANEOUS) ×2 IMPLANT
NS IRRIG 1000ML POUR BTL (IV SOLUTION) ×2 IMPLANT
PACK ORTHO EXTREMITY (CUSTOM PROCEDURE TRAY) ×2 IMPLANT
PAD ARMBOARD 7.5X6 YLW CONV (MISCELLANEOUS) ×4 IMPLANT
SET HNDPC FAN SPRY TIP SCT (DISPOSABLE) IMPLANT
SLING ARM FOAM STRAP XLG (SOFTGOODS) ×2 IMPLANT
SPONGE GAUZE 4X4 12PLY STER LF (GAUZE/BANDAGES/DRESSINGS) ×2 IMPLANT
SPONGE LAP 18X18 X RAY DECT (DISPOSABLE) IMPLANT
STOCKINETTE IMPERVIOUS 9X36 MD (GAUZE/BANDAGES/DRESSINGS) IMPLANT
SUT ETHILON 3 0 PS 1 (SUTURE) ×2 IMPLANT
SUT PDS AB 2-0 CT1 27 (SUTURE) IMPLANT
SYR CONTROL 10ML LL (SYRINGE) ×2 IMPLANT
TOWEL OR 17X24 6PK STRL BLUE (TOWEL DISPOSABLE) ×2 IMPLANT
TOWEL OR 17X26 10 PK STRL BLUE (TOWEL DISPOSABLE) ×2 IMPLANT
TUBE ANAEROBIC SPECIMEN COL (MISCELLANEOUS) IMPLANT
TUBE CONNECTING 12X1/4 (SUCTIONS) ×2 IMPLANT
TUBING CYSTO DISP (UROLOGICAL SUPPLIES) ×2 IMPLANT
UNDERPAD 30X30 (UNDERPADS AND DIAPERS) ×2 IMPLANT
YANKAUER SUCT BULB TIP NO VENT (SUCTIONS) ×2 IMPLANT

## 2016-04-26 NOTE — Progress Notes (Signed)
Pharmacy Antibiotic Note  Carlos Yates is a 50 y.o. male admitted on 04/23/2016 with cellulitis.  Pharmacy has been consulted for vancomycin dosing.  Plan: - Vanc 1250mg  IV Q12H - Monitor renal fx, lot  - VT in am 0530  Height: 5\' 11"  (180.3 cm) Weight: 220 lb (99.8 kg) IBW/kg (Calculated) : 75.3  Temp (24hrs), Avg:98 F (36.7 C), Min:97.6 F (36.4 C), Max:98.4 F (36.9 C)   Recent Labs Lab 04/23/16 1231 04/24/16 0528 04/25/16 0712 04/25/16 0827 04/26/16 0517  WBC 9.4 7.5 7.1  --  7.3  CREATININE 1.25* 1.20  --  1.20 1.19    Estimated Creatinine Clearance: 91.4 mL/min (by C-G formula based on SCr of 1.19 mg/dL).    No Known Allergies  Isaac BlissMichael Shekira Drummer, PharmD, BCPS, BCCCP Clinical Pharmacist Clinical phone for 04/26/2016 from 7a-3:30p: [phone] If after 3:30p, please call main pharmacy at: [phone] 04/26/2016 11:02 AM

## 2016-04-26 NOTE — Anesthesia Procedure Notes (Signed)
Procedure Name: LMA Insertion Date/Time: 04/26/2016 7:46 AM Performed by: Fransisca KaufmannMEYER, Johann Gascoigne E Pre-anesthesia Checklist: Patient identified, Emergency Drugs available, Suction available and Patient being monitored Patient Re-evaluated:Patient Re-evaluated prior to inductionOxygen Delivery Method: Circle System Utilized Preoxygenation: Pre-oxygenation with 100% oxygen Intubation Type: IV induction Ventilation: Mask ventilation without difficulty LMA: LMA inserted LMA Size: 5.0 Number of attempts: 1 Airway Equipment and Method: Bite block Placement Confirmation: positive ETCO2 Tube secured with: Tape Dental Injury: Teeth and Oropharynx as per pre-operative assessment

## 2016-04-26 NOTE — Anesthesia Postprocedure Evaluation (Signed)
Anesthesia Post Note  Patient: Carlos Yates  Procedure(s) Performed: Procedure(s) (LRB): IRRIGATION AND DEBRIDEMENT RIGHT ELBOW WITH BURSECTOMY (Right)  Patient location during evaluation: PACU Anesthesia Type: General Level of consciousness: sedated Pain management: satisfactory to patient Vital Signs Assessment: post-procedure vital signs reviewed and stable Respiratory status: spontaneous breathing Cardiovascular status: stable Anesthetic complications: no    Last Vitals:  Vitals:   04/26/16 0945 04/26/16 0957  BP: (!) 157/114 (!) 153/91  Pulse: 75 76  Resp: (!) 9 16  Temp: 36.6 C 36.5 C    Last Pain:  Vitals:   04/26/16 0957  TempSrc: Axillary  PainSc:                  Jiles GarterJACKSON,Francile Woolford EDWARD

## 2016-04-26 NOTE — Progress Notes (Signed)
1000 received pt from PACU, A&O x4, sleepy. Right elbow with elastic wrap dry and intact. 1300 Pt tolerated regular diet. Voiding spontaneously. Medicated for pain.

## 2016-04-26 NOTE — Discharge Instructions (Signed)
Take the two types of oral antibiotics as prescribed.  Diet: As you were doing prior to hospitalization   Shower:  Keep dressings in place and dry.  Dressing:  Leave in place until follow up in the office 04/30/16.  Activity:  Increase activity slowly as tolerated, but follow the weight bearing instructions below.  The rules on driving is that you can not be taking narcotics while you drive, and you must feel in control of the vehicle.    Weight Bearing:   As tolerated, but limit strenuous use of right arm.  Do not bend arm past 90 degrees.  Maintain sling as needed for comfort.  To prevent constipation: you may use a stool softener such as -  Colace (over the counter) 100 mg by mouth twice a day  Drink plenty of fluids (prune juice may be helpful) and high fiber foods Miralax (over the counter) for constipation as needed.    Itching:  If you experience itching with your medications, try taking only a single pain pill, or even half a pain pill at a time.  You may take up to 10 pain pills per day, and you can also use benadryl over the counter for itching or also to help with sleep.   Precautions:  If you experience chest pain or shortness of breath - call 911 immediately for transfer to the hospital emergency department!!  If you develop a fever greater that 101 F, purulent drainage from wound, increased redness or drainage from wound, or calf pain -- Call the office at [phone]                                                Follow- Up Appointment:  Please call for an appointment to be seen on Monday 04/30/16- [phone]

## 2016-04-26 NOTE — Progress Notes (Signed)
Report given to robin roberts rn as caregiver 

## 2016-04-26 NOTE — Op Note (Signed)
04/23/2016 - 04/26/2016  8:17 AM  PATIENT:  Carlos Yates    PRE-OPERATIVE DIAGNOSIS:  RIGHT ELBOW BURSITIS  POST-OPERATIVE DIAGNOSIS:  Same  PROCEDURE:  IRRIGATION AND DEBRIDEMENT RIGHT ELBOW  SURGEON:  Neomi Laidler, Jewel BaizeIMOTHY D, MD  ASSISTANT: Aquilla HackerHenry Martensen, PA-C, he was present and scrubbed throughout the case, critical for completion in a timely fashion, and for retraction, instrumentation, and closure.   ANESTHESIA:   gen  PREOPERATIVE INDICATIONS:  Carlos Yates is a  50 y.o. male with a diagnosis of RIGHT ELBOW BURSITIS who failed conservative measures and elected for surgical management.    The risks benefits and alternatives were discussed with the patient preoperatively including but not limited to the risks of infection, bleeding, nerve injury, cardiopulmonary complications, the need for revision surgery, among others, and the patient was willing to proceed.  OPERATIVE IMPLANTS: none  OPERATIVE FINDINGS: purulent fluid with necrotic fat  BLOOD LOSS: 30cc  COMPLICATIONS: none  TOURNIQUET TIME: none  OPERATIVE PROCEDURE:  Patient was identified in the preoperative holding area and site was marked by me He was transported to the operating theater and placed on the table in supine position taking care to pad all bony prominences. After a preincinduction time out anesthesia was induced. The right upper extremity was prepped and draped in normal sterile fashion and a pre-incision timeout was performed. He received ancef post culture for preoperative antibiotics.   I made a longitudinal incision over his olecranon bursa. Immediately purulent fluid was expressed second elected this as well as some necrotic fat for culture this is then sent off and Ancef was given.  I extended the incision proximally and distally all told about 4 cm worth of incision. All purulent fluid was expressed I probed the wound to confirm there were no additional pockets it did track proximally over his superficial  border of his triceps.  His olecranon was exposed after naproxen necrotic tissue was washed out.  After confirmed or no additional pockets of purulence I performed an excisional debridement of any additional necrotic tissue using scissors Ronjair.  I then thoroughly irrigated his wound with 3 L of saline. I then placed a drain and closed it with interrupted nylon stitches. Sterile dressing was applied he was awoken and taken the PACU in stable condition  POST OPERATIVE PLAN: Transition to by mouth antibiotics if stable he'll discharge from the standard sling he'll mobilize for DVT prophylaxis

## 2016-04-26 NOTE — Progress Notes (Signed)
PROGRESS NOTE    Carlos Yates  ZOX:[id number] DOB: 03-10-1966 DOA: 04/23/2016 PCP: Georgianne FickAMACHANDRAN,AJITH, MD    Brief Narrative:  50 y.o.malewith medical history significant of knee replacement. Patient [facility] with worsening pain, swelling of his right elbow. Patient reports noticing a small bump which became increasingly painful over past few days prior to this admission. He went to urgent care and he was given Rocephin and Decadron. He was also given Augmentin and then reevaluated the following day in urgent care. There was no significant improvement since he went to see his PCP who recommended going to ER for evaluation. He was subsequently found to have soft tissue edema, cellulitis based on x-rays. He was started on vancomycin.   Assessment & Plan:   Principal Problem:   Right arm cellulitis Active Problems:   Acute kidney injury (HCC)   Septic olecranon bursitis of right elbow   Right arm cellulitis - No evidence of acute fracture or osteomyelitis on x-ray. Soft tissue edema, cellulitis seen on x-ray - Patient still has significant swelling, tenderness and redness in right arm and states he does not feel as though the arm has gotten any better - Discontinue Vancomyin - Start Dicloxacillin 500mg  QID - patient underwent IRRIGATION AND DEBRIDEMENT RIGHT ELBOW this am - pain control with ketorolac and oxycodone   Active Problems:   Acute kidney injury (HCC) - Unclear etiology but with IV fluids it has subsequently normalized - will repeat BMP in am after IV contrast - will give 1L NS at 12100ml/hr - will repeat BMP in am    DVT prophylaxis: lovenox Code Status: Full code Family Communication: Mother and father are bedside Disposition Plan: likely discharge in am   Consultants:   Orthopedic Surgery  Procedures:  IRRIGATION AND DEBRIDEMENT RIGHT ELBOW 12/14  Antimicrobials:   Vancomycin 12/11> 12/14  Dicloxacillin  12/14   Subjective: Patient very sleepy when seen after surgery.  Says pain is fairly significant.  Says he is concerned about care at home.  Objective: Vitals:   04/26/16 0945 04/26/16 0957 04/26/16 1301 04/26/16 1600  BP: (!) 157/114 (!) 153/91 (!) 141/101 (!) 138/94  Pulse: 75 76 77 83  Resp: (!) 9 16 18    Temp: 97.9 F (36.6 C) 97.7 F (36.5 C) 98.1 F (36.7 C)   TempSrc:  Axillary Oral   SpO2: 95% 98% 94%   Weight:      Height:        Intake/Output Summary (Last 24 hours) at 04/26/16 1640 Last data filed at 04/26/16 1304  Gross per 24 hour  Intake             1270 ml  Output               15 ml  Net             1255 ml   Filed Weights   04/23/16 1144  Weight: 99.8 kg (220 lb)    Examination:  General exam: Appears calm and comfortable  Respiratory system: Clear to auscultation. Respiratory effort normal. Cardiovascular system: S1 & S2 heard, RRR. No JVD, murmurs, rubs, gallops or clicks. No pedal edema. Gastrointestinal system: Abdomen is nondistended, soft and nontender. No organomegaly or masses felt. Normal bowel sounds heard. Central nervous system: Alert and oriented. No focal neurological deficits. Extremities: ace bandage over surgical site on right upper extremity over elbow Skin: right elbow not examined 2/2 operative dressing Psychiatry: Judgement and insight appear normal. Mood & affect  appropriate.     Data Reviewed: I have personally reviewed following labs and imaging studies  CBC:  Recent Labs Lab 04/23/16 1231 04/24/16 0528 04/25/16 0712 04/26/16 0517  WBC 9.4 7.5 7.1 7.3  NEUTROABS 6.2  --   --   --   HGB 12.8* 11.9* 12.4* 12.3*  HCT 38.0* 37.3* 37.8* 38.4*  MCV 92.0 92.6 93.1 93.9  PLT 238 221 233 246   Basic Metabolic Panel:  Recent Labs Lab 04/23/16 1231 04/24/16 0528 04/25/16 0827 04/26/16 0517  NA 140 137 139 138  K 4.0 3.9 4.3 4.5  CL 103 102 105 102  CO2 27 24 26 27   GLUCOSE 95 112* 112* 107*  BUN 12 12 12 12    CREATININE 1.25* 1.20 1.20 1.19  CALCIUM 9.0 8.5* 9.1 9.0   GFR: Estimated Creatinine Clearance: 91.4 mL/min (by C-G formula based on SCr of 1.19 mg/dL). Liver Function Tests: No results for input(s): AST, ALT, ALKPHOS, BILITOT, PROT, ALBUMIN in the last 168 hours. No results for input(s): LIPASE, AMYLASE in the last 168 hours. No results for input(s): AMMONIA in the last 168 hours. Coagulation Profile: No results for input(s): INR, PROTIME in the last 168 hours. Cardiac Enzymes: No results for input(s): CKTOTAL, CKMB, CKMBINDEX, TROPONINI in the last 168 hours. BNP (last 3 results) No results for input(s): PROBNP in the last 8760 hours. HbA1C: No results for input(s): HGBA1C in the last 72 hours. CBG: No results for input(s): GLUCAP in the last 168 hours. Lipid Profile: No results for input(s): CHOL, HDL, LDLCALC, TRIG, CHOLHDL, LDLDIRECT in the last 72 hours. Thyroid Function Tests: No results for input(s): TSH, T4TOTAL, FREET4, T3FREE, THYROIDAB in the last 72 hours. Anemia Panel: No results for input(s): VITAMINB12, FOLATE, FERRITIN, TIBC, IRON, RETICCTPCT in the last 72 hours. Sepsis Labs: No results for input(s): PROCALCITON, LATICACIDVEN in the last 168 hours.  Recent Results (from the past 240 hour(s))  Culture, blood (Routine X 2) w Reflex to ID Panel     Status: None (Preliminary result)   Collection Time: 04/23/16  4:36 PM  Result Value Ref Range Status   Specimen Description BLOOD RIGHT FOREARM  Final   Special Requests BOTTLES DRAWN AEROBIC AND ANAEROBIC 5ML  Final   Culture NO GROWTH 3 DAYS  Final   Report Status PENDING  Incomplete  Culture, blood (Routine X 2) w Reflex to ID Panel     Status: None (Preliminary result)   Collection Time: 04/23/16  6:40 PM  Result Value Ref Range Status   Specimen Description BLOOD LEFT HAND  Final   Special Requests BOTTLES DRAWN AEROBIC AND ANAEROBIC 5CC  Final   Culture NO GROWTH 3 DAYS  Final   Report Status PENDING   Incomplete  Surgical pcr screen     Status: None   Collection Time: 04/26/16  1:08 AM  Result Value Ref Range Status   MRSA, PCR NEGATIVE NEGATIVE Final   Staphylococcus aureus NEGATIVE NEGATIVE Final    Comment:        The Xpert SA Assay (FDA approved for NASAL specimens in patients over 48 years of age), is one component of a comprehensive surveillance program.  Test performance has been validated by [facility] for patients greater than or equal to 48 year old. It is not intended to diagnose infection nor to guide or monitor treatment.   Anaerobic culture     Status: None (Preliminary result)   Collection Time: 04/26/16  8:08 AM  Result Value Ref  Range Status   Specimen Description JOINT FLUID RIGHT  Final   Special Requests   Final    OLECRANON BURSA PATIENT ON FOLLOWING  ANCEF AND VANCOMYCIN   Gram Stain PENDING  Incomplete   Culture PENDING  Incomplete   Report Status PENDING  Incomplete  Body fluid culture     Status: None (Preliminary result)   Collection Time: 04/26/16  8:08 AM  Result Value Ref Range Status   Specimen Description JOINT FLUID RIGHT  Final   Special Requests   Final    OLECRANON BURSA PATIENT ON FOLLOWING  ANCEF AND VANCOMYCIN   Gram Stain   Final    FEW WBC PRESENT, PREDOMINANTLY PMN NO ORGANISMS SEEN    Culture PENDING  Incomplete   Report Status PENDING  Incomplete         Radiology Studies: Ct Extrem Up Entire Arm R W/cm  Result Date: 04/25/2016 CLINICAL DATA:  Cellulitis. EXAM: CT OF THE UPPER RIGHT EXTREMITY WITH CONTRAST TECHNIQUE: Multidetector CT imaging of the upper right extremity was performed according to the standard protocol following intravenous contrast administration. COMPARISON:  Forearm radiographs dated 04/23/2016 low CONTRAST:  ISOVUE-300 IOPAMIDOL (ISOVUE-300) INJECTION 61% FINDINGS: Bones/Joint/Cartilage Negative. No joint effusion. Ligaments Suboptimally assessed by CT. Muscles and Tendons Negative Soft tissues  There is fluid distending the olecranon bursa with soft tissue edema in the adjacent subcutaneous fat extending proximally and distally from the bursa. The edema extends 2/3 of the way down the forearm. IMPRESSION: Findings consistent with olecranon bursitis. Given the patient's clinical history, the finding is worrisome for infected olecranon bursitis. Electronically Signed   By: Francene Boyers M.D.   On: 04/25/2016 16:21        Scheduled Meds: . docusate sodium  100 mg Oral BID  . enoxaparin (LOVENOX) injection  40 mg Subcutaneous Q24H  . HYDROmorphone      . HYDROmorphone      . ketorolac  15 mg Intravenous Q6H  . ketorolac      . methocarbamol      . vancomycin  1,250 mg Intravenous Q12H   Continuous Infusions: . lactated ringers Stopped (04/26/16 1630)     LOS: 3 days    Time spent: 30 minutes     Katrinka Blazing, MD [facility] Pager [phone]  If 7PM-7AM, please contact night-coverage [email or web address] Password [email or web address] 04/26/2016, 4:40 PM

## 2016-04-26 NOTE — Transfer of Care (Signed)
Immediate Anesthesia Transfer of Care Note  Patient: Carlos Yates  Procedure(s) Performed: Procedure(s): IRRIGATION AND DEBRIDEMENT RIGHT ELBOW WITH BURSECTOMY (Right)  Patient Location: PACU  Anesthesia Type:General  Level of Consciousness: awake, alert , oriented and sedated  Airway & Oxygen Therapy: Patient Spontanous Breathing and Patient connected to nasal cannula oxygen  Post-op Assessment: Report given to RN, Post -op Vital signs reviewed and stable and Patient moving all extremities  Post vital signs: Reviewed and stable  Last Vitals:  Vitals:   04/26/16 0604 04/26/16 0840  BP: (!) 139/102   Pulse: 90   Resp: 18 (P) 16  Temp: 36.8 C (P) 36.4 C    Last Pain:  Vitals:   04/26/16 0604  TempSrc: Oral  PainSc:       Patients Stated Pain Goal: 2 (04/25/16 0939)  Complications: No apparent anesthesia complications

## 2016-04-26 NOTE — Interval H&P Note (Signed)
History and Physical Interval Note:  04/26/2016 7:37 AM  Carlos Yates  has presented today for surgery, with the diagnosis of RIGHT ELBOW BURSITIS  The various methods of treatment have been discussed with the patient and family. After consideration of risks, benefits and other options for treatment, the patient has consented to  Procedure(s): IRRIGATION AND DEBRIDEMENT RIGHT ELBOW (Right) as a surgical intervention .  The patient's history has been reviewed, patient examined, no change in status, stable for surgery.  I have reviewed the patient's chart and labs.  Questions were answered to the patient's satisfaction.     [name] D

## 2016-04-26 NOTE — H&P (View-Only) (Signed)
   ORTHOPAEDIC CONSULTATION  REQUESTING PHYSICIAN: Alexandria U Kadolph, MD  Chief Complaint: Right elbow pain  Assessment / Plan: Principal Problem:   Right arm cellulitis Active Problems:   Acute kidney injury (HCC)  Septic olecranon bursitis.   I&D with possible drain placement recommended and planned for tomorrow morning.   Nothing by mouth after midnight Weight Bearing Status: Wbat  Ice and elevate for comfort VTE px: SCD's, mobilize, and lovenox.    The risks benefits and alternatives of the procedure were discussed with the patient. The patient verbalizes understanding and wishes to proceed.    HPI: Carlos Yates is a 50 y.o. male who complains of right elbow pain increasing since last Thursday. Initially appeared as a small bump and became increasingly red, swollen, and painful. He went to urgent care and received IV Rocephin and by mouth Augmentin. He notes fever on Friday. It did not significantly improve over the weekend so he presented to his PCP who referred him to the ED. Since admission he's had minimal improvement now on IV antibiotics.  Orthopedics was consulted for suspected infected olecranon bursitis.  Past Medical History:  Diagnosis Date  . Cellulitis 04/2016   RT ELBOW    Past Surgical History:  Procedure Laterality Date  . ABDOMINAL SURGERY    . JOINT REPLACEMENT     knee   Social History   Social History  . Marital status: Unknown    Spouse name: N/A  . Number of children: N/A  . Years of education: N/A   Social History Main Topics  . Smoking status: Never Smoker  . Smokeless tobacco: Never Used  . Alcohol use No  . Drug use: No  . Sexual activity: Not Asked   Other Topics Concern  . None   Social History Narrative  . None   History reviewed. No pertinent family history. No Known Allergies Prior to Admission medications   Medication Sig Start Date End Date Taking? Authorizing Provider  amoxicillin-clavulanate (AUGMENTIN) 875-125  MG tablet Take 1 tablet by mouth 2 (two) times daily.   Yes Historical Provider, MD   Ct Extrem Up Entire Arm R W/cm  Result Date: 04/25/2016 CLINICAL DATA:  Cellulitis. EXAM: CT OF THE UPPER RIGHT EXTREMITY WITH CONTRAST TECHNIQUE: Multidetector CT imaging of the upper right extremity was performed according to the standard protocol following intravenous contrast administration. COMPARISON:  Forearm radiographs dated 04/23/2016 low CONTRAST:  100mL ISOVUE-300 IOPAMIDOL (ISOVUE-300) INJECTION 61% FINDINGS: Bones/Joint/Cartilage Negative. No joint effusion. Ligaments Suboptimally assessed by CT. Muscles and Tendons Negative Soft tissues There is fluid distending the olecranon bursa with soft tissue edema in the adjacent subcutaneous fat extending proximally and distally from the bursa. The edema extends 2/3 of the way down the forearm. IMPRESSION: Findings consistent with olecranon bursitis. Given the patient's clinical history, the finding is worrisome for infected olecranon bursitis. Electronically Signed   By: James  Maxwell M.D.   On: 04/25/2016 16:21    Positive ROS: All other systems have been reviewed and were otherwise negative with the exception of those mentioned in the HPI and as above.  Objective: Labs cbc  Recent Labs  04/24/16 0528 04/25/16 0712  WBC 7.5 7.1  HGB 11.9* 12.4*  HCT 37.3* 37.8*  PLT 221 233    Labs inflam  Recent Labs  04/23/16 1636  CRP 6.9*    Labs coag No results for input(s): INR, PTT in the last 72 hours.  Invalid input(s): PT   Recent Labs  04/24/16   0528 04/25/16 0827  NA 137 139  K 3.9 4.3  CL 102 105  CO2 24 26  GLUCOSE 112* 112*  BUN 12 12  CREATININE 1.20 1.20  CALCIUM 8.5* 9.1    Physical Exam: Vitals:   04/25/16 0138 04/25/16 0524  BP: (!) 145/88 (!) 139/96  Pulse: 76 73  Resp: 17 18  Temp: 99.1 F (37.3 C) 97.8 F (36.6 C)   General: Alert, no acute distress Mental status: Alert and Oriented x3 Neurologic: Speech  Clear and organized, no gross focal findings or movement disorder appreciated. Respiratory: No cyanosis, no use of accessory musculature Cardiovascular: No pedal edema GI: Abdomen is soft and non-tender, non-distended. Skin: Warm and dry.   Extremities: Warm and well perfused w/o edema Psychiatric: Patient is competent for consent with normal mood and affect  MUSCULOSKELETAL:  Right elbow with significant swelling, warmth, and erythema. Right olecranon with bogginess/area of fluctuance. No open wound/lesion or drainage. Nearly full, apparently painless range of motion and use of right arm. Neurovascular intact distally. Distal sensation intact. Full use of right hand. Other extremities are atraumatic with painless ROM and NVI.   Albina BilletHenry Calvin Martensen III PA-C 04/25/2016 7:28 PM

## 2016-04-27 ENCOUNTER — Encounter (HOSPITAL_COMMUNITY): Payer: Self-pay | Admitting: Orthopedic Surgery

## 2016-04-27 MED ORDER — SENNA 8.6 MG PO TABS: 1.0000 | ORAL_TABLET | Freq: Every day | ORAL | 0 refills | Status: AC

## 2016-04-27 MED ORDER — ACETAMINOPHEN 325 MG PO TABS: 650.0000 mg | ORAL_TABLET | Freq: Four times a day (QID) | ORAL | Status: AC | PRN

## 2016-04-27 NOTE — Discharge Summary (Signed)
Physician Discharge Summary  Carlos Yates ZOX:[id number] DOB: 04-02-1966 DOA: 04/23/2016  PCP: Georgianne Fick, MD  Admit date: 04/23/2016 Discharge date: 04/27/2016  Admitted From: Home  Disposition:  Home   Recommendations for Outpatient Follow-up:  1. Follow up with PCP in 1-2 weeks 2. F/u with orthopedics at previously discussed follow up on 12/18 3. Take medications as prescribed 4. Please obtain BMP/CBC in one week 5. Please follow up on the following pending results:  Home Health: No Equipment/Devices: None  Discharge Condition: Stable CODE STATUS:Full code Diet recommendation: Heart Healthy  Brief/Interim Summary: 50 y.o.malewith medical history significant of knee replacement. Patient presented to [facility] with worsening pain, swelling of his right elbow. Patient reports noticing a small bump which became increasingly painful over past few days prior to this admission. He went to urgent care and he was given Rocephin and Decadron. He was also given Augmentin and then reevaluated the following day in urgent care. There was no significant improvement since he went to see his PCP who recommended going to ER for evaluation. He was subsequently found to have soft tissue edema, cellulitis based on x-rays. He was started on vancomycin.  Patient failed to improve on vancomycin and so CT scan of the upper extremity was ordered.  Scan showed possible infected olecranon bursitis.  Orthopedic surgery consulted and took patient to OR on 12/14.  He was transitioned to PO antibiotics and was stable for discharge on 12/15.  Discharge Diagnoses:  Principal Problem:   Right arm cellulitis Active Problems:   Acute kidney injury (HCC)   Septic olecranon bursitis of right elbow    Discharge Instructions  Discharge Instructions    Call MD for:  difficulty breathing, headache or visual disturbances    Complete by:  As directed    Call MD for:  extreme fatigue    Complete by:   As directed    Call MD for:  persistant dizziness or light-headedness    Complete by:  As directed    Call MD for:  persistant nausea and vomiting    Complete by:  As directed    Call MD for:  redness, tenderness, or signs of infection (pain, swelling, redness, odor or green/yellow discharge around incision site)    Complete by:  As directed    Call MD for:  severe uncontrolled pain    Complete by:  As directed    Call MD for:  temperature >100.4    Complete by:  As directed    Diet - low sodium heart healthy    Complete by:  As directed    Discharge instructions    Complete by:  As directed    Avoid strenous exercise of affected arm, do not bend more than 90 degrees   Increase activity slowly    Complete by:  As directed      Allergies as of 04/27/2016   No Known Allergies     Medication List    STOP taking these medications   amoxicillin-clavulanate 875-125 MG tablet Commonly known as:  AUGMENTIN     TAKE these medications   acetaminophen 325 MG tablet Commonly known as:  TYLENOL Take 2 tablets (650 mg total) by mouth every 6 (six) hours as needed for mild pain (or Fever >/= 101).   cephALEXin 500 MG capsule Commonly known as:  KEFLEX Take 1 capsule (500 mg total) by mouth 3 (three) times daily.   docusate sodium 100 MG capsule Commonly known as:  COLACE Take 1  capsule (100 mg total) by mouth 2 (two) times daily.   doxycycline 50 MG capsule Commonly known as:  VIBRAMYCIN Take 2 capsules (100 mg total) by mouth 2 (two) times daily.   methocarbamol 500 MG tablet Commonly known as:  ROBAXIN Take 1 tablet (500 mg total) by mouth every 6 (six) hours as needed for muscle spasms.   ondansetron 4 MG tablet Commonly known as:  ZOFRAN Take 1 tablet (4 mg total) by mouth every 8 (eight) hours as needed for nausea or vomiting.   oxyCODONE-acetaminophen 5-325 MG tablet Commonly known as:  ROXICET Take 1-2 tablets by mouth every 4 (four) hours as needed for severe  pain.   senna 8.6 MG Tabs tablet Commonly known as:  SENOKOT Take 1 tablet (8.6 mg total) by mouth daily.      Follow-up Information    MURPHY, TIMOTHY D, MD. Schedule an appointment as soon as possible for a visit on 04/30/2016.   Specialty:  Orthopedic Surgery Contact information: [address] [place] [address] [phone]        Georgianne Fick, MD. Schedule an appointment as soon as possible for a visit in 2 week(s).   Specialty:  Internal Medicine Contact information: 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 He noticed a "zit" on his right elbow. It became increasing painful over Friday but was too late to go to the urgent care so he went on Saturday. He was given IV rocephin and IV decadron x 1. He was then given augmentin to take at home. He was re-evaluated at the urgent care on Sunday. Pain and swelling was about the same. Sunday PM, his pain increased and swelling increased as well. He went to his PCP today who sent him to the ER EXAM: RIGHT FOREARM - 2 VIEW COMPARISON:  None. FINDINGS: No fracture.  No bone lesion.  No evidence of osteomyelitis. Wrist and elbow joints are normally aligned. There is subcutaneous soft tissue edema/cellulitis which predominates dorsally. No soft tissue air or radiopaque foreign body. IMPRESSION: 1. No fracture, bone lesion or evidence of osteomyelitis. 2. Soft tissue cellulitis and/or edema.  No soft tissue air. Electronically Signed   By: Amie Portland M.D.   On: 04/23/2016 17:11   Ct Extrem Up Entire Arm R W/cm  Result Date: 04/25/2016 CLINICAL DATA:  Cellulitis. EXAM: CT OF THE UPPER RIGHT EXTREMITY WITH CONTRAST TECHNIQUE: Multidetector CT imaging of the upper  right extremity was performed according to the standard protocol following intravenous contrast administration. COMPARISON:  Forearm radiographs dated 04/23/2016 low CONTRAST:  ISOVUE-300 IOPAMIDOL (ISOVUE-300) INJECTION 61% FINDINGS: Bones/Joint/Cartilage Negative. No joint effusion. Ligaments Suboptimally assessed by CT. Muscles and Tendons Negative Soft tissues There is fluid distending the olecranon bursa with soft tissue edema in the adjacent subcutaneous fat extending proximally and distally from the bursa. The edema extends 2/3 of the way down the forearm. IMPRESSION: Findings consistent with olecranon bursitis. Given the patient's clinical history, the finding is worrisome for infected olecranon bursitis. Electronically Signed   By: Francene Boyers M.D.   On: 04/25/2016 16:21      Subjective: Patient feeling well this morning.  Understands instructions from orthopedic surgery.  No questions voiced.  Discharge Exam: Vitals:  04/27/16 0214 04/27/16 0539  BP: (!) 145/82 131/81  Pulse: 82 89  Resp: 16 15  Temp: 98.6 F (37 C) 98.2 F (36.8 C)   Vitals:   04/26/16 1734 04/26/16 2134 04/27/16 0214 04/27/16 0539  BP: (!) 141/90 134/80 (!) 145/82 131/81  Pulse: 86 81 82 89  Resp: 16 16 16 15   Temp: 98.9 F (37.2 C) 97.8 F (36.6 C) 98.6 F (37 C) 98.2 F (36.8 C)  TempSrc: Oral Oral Oral Oral  SpO2: 95% 96% 96% 96%  Weight:      Height:        General: Pt is alert, awake, not in acute distress Cardiovascular: RRR, S1/S2 +, no rubs, no gallops Respiratory: CTA bilaterally, no wheezing, no rhonchi Abdominal: Soft, NT, ND, bowel sounds + Extremities: no edema, no cyanosis, right elbow wrapped in ace bandage    The results of significant diagnostics from this hospitalization (including imaging, microbiology, ancillary and laboratory) are listed below for reference.     Microbiology: Recent Results (from the past 240 hour(s))  Culture, blood (Routine X 2) w Reflex to  ID Panel     Status: None (Preliminary result)   Collection Time: 04/23/16  4:36 PM  Result Value Ref Range Status   Specimen Description BLOOD RIGHT FOREARM  Final   Special Requests BOTTLES DRAWN AEROBIC AND ANAEROBIC  Final   Culture NO GROWTH 4 DAYS  Final   Report Status PENDING  Incomplete  Culture, blood (Routine X 2) w Reflex to ID Panel     Status: None (Preliminary result)   Collection Time: 04/23/16  6:40 PM  Result Value Ref Range Status   Specimen Description BLOOD LEFT HAND  Final   Special Requests BOTTLES DRAWN AEROBIC AND ANAEROBIC 5CC  Final   Culture NO GROWTH 4 DAYS  Final   Report Status PENDING  Incomplete  Surgical pcr screen     Status: None   Collection Time: 04/26/16  1:08 AM  Result Value Ref Range Status   MRSA, PCR NEGATIVE NEGATIVE Final   Staphylococcus aureus NEGATIVE NEGATIVE Final    Comment:        The Xpert SA Assay (FDA approved for NASAL specimens in patients over 38 years of age), is one component of a comprehensive surveillance program.  Test performance has been validated by [facility] for patients greater than or equal to 51 year old. It is not intended to diagnose infection nor to guide or monitor treatment.   Anaerobic culture     Status: None (Preliminary result)   Collection Time: 04/26/16  8:08 AM  Result Value Ref Range Status   Specimen Description JOINT FLUID RIGHT  Final   Special Requests   Final    OLECRANON BURSA PATIENT ON FOLLOWING  ANCEF AND VANCOMYCIN   Culture   Final    NO ANAEROBES ISOLATED; CULTURE IN PROGRESS FOR 5 DAYS   Report Status PENDING  Incomplete  Body fluid culture     Status: None (Preliminary result)   Collection Time: 04/26/16  8:08 AM  Result Value Ref Range Status   Specimen Description JOINT FLUID RIGHT  Final   Special Requests   Final    OLECRANON BURSA PATIENT ON FOLLOWING  ANCEF AND VANCOMYCIN   Gram Stain   Final    FEW WBC PRESENT, PREDOMINANTLY PMN NO ORGANISMS SEEN    Culture  NO GROWTH 1 DAY  Final   Report Status PENDING  Incomplete  Labs: BNP (last 3 results) No results for input(s): BNP in the last 8760 hours. Basic Metabolic Panel:  Recent Labs Lab 04/23/16 1231 04/24/16 0528 04/25/16 0827 04/26/16 0517  NA 140 137 139 138  K 4.0 3.9 4.3 4.5  CL 103 102 105 102  CO2 27 24 26 27   GLUCOSE 95 112* 112* 107*  BUN 12 12 12 12   CREATININE 1.25* 1.20 1.20 1.19  CALCIUM 9.0 8.5* 9.1 9.0   Liver Function Tests: No results for input(s): AST, ALT, ALKPHOS, BILITOT, PROT, ALBUMIN in the last 168 hours. No results for input(s): LIPASE, AMYLASE in the last 168 hours. No results for input(s): AMMONIA in the last 168 hours. CBC:  Recent Labs Lab 04/23/16 1231 04/24/16 0528 04/25/16 0712 04/26/16 0517  WBC 9.4 7.5 7.1 7.3  NEUTROABS 6.2  --   --   --   HGB 12.8* 11.9* 12.4* 12.3*  HCT 38.0* 37.3* 37.8* 38.4*  MCV 92.0 92.6 93.1 93.9  PLT 238 221 233 246   Cardiac Enzymes: No results for input(s): CKTOTAL, CKMB, CKMBINDEX, TROPONINI in the last 168 hours. BNP: Invalid input(s): POCBNP CBG: No results for input(s): GLUCAP in the last 168 hours. D-Dimer No results for input(s): DDIMER in the last 72 hours. Hgb A1c No results for input(s): HGBA1C in the last 72 hours. Lipid Profile No results for input(s): CHOL, HDL, LDLCALC, TRIG, CHOLHDL, LDLDIRECT in the last 72 hours. Thyroid function studies No results for input(s): TSH, T4TOTAL, T3FREE, THYROIDAB in the last 72 hours.  Invalid input(s): FREET3 Anemia work up No results for input(s): VITAMINB12, FOLATE, FERRITIN, TIBC, IRON, RETICCTPCT in the last 72 hours. Urinalysis No results found for: COLORURINE, APPEARANCEUR, LABSPEC, PHURINE, GLUCOSEU, HGBUR, BILIRUBINUR, KETONESUR, PROTEINUR, UROBILINOGEN, NITRITE, LEUKOCYTESUR Sepsis Labs Invalid input(s): PROCALCITONIN,  WBC,  LACTICIDVEN Microbiology Recent Results (from the past 240 hour(s))  Culture, blood (Routine X 2) w Reflex to ID  Panel     Status: None (Preliminary result)   Collection Time: 04/23/16  4:36 PM  Result Value Ref Range Status   Specimen Description BLOOD RIGHT FOREARM  Final   Special Requests BOTTLES DRAWN AEROBIC AND ANAEROBIC 5ML  Final   Culture NO GROWTH 4 DAYS  Final   Report Status PENDING  Incomplete  Culture, blood (Routine X 2) w Reflex to ID Panel     Status: None (Preliminary result)   Collection Time: 04/23/16  6:40 PM  Result Value Ref Range Status   Specimen Description BLOOD LEFT HAND  Final   Special Requests BOTTLES DRAWN AEROBIC AND ANAEROBIC 5CC  Final   Culture NO GROWTH 4 DAYS  Final   Report Status PENDING  Incomplete  Surgical pcr screen     Status: None   Collection Time: 04/26/16  1:08 AM  Result Value Ref Range Status   MRSA, PCR NEGATIVE NEGATIVE Final   Staphylococcus aureus NEGATIVE NEGATIVE Final    Comment:        The Xpert SA Assay (FDA approved for NASAL specimens in patients over 48 years of age), is one component of a comprehensive surveillance program.  Test performance has been validated by [facility] for patients greater than or equal to 48 year old. It is not intended to diagnose infection nor to guide or monitor treatment.   Anaerobic culture     Status: None (Preliminary result)   Collection Time: 04/26/16  8:08 AM  Result Value Ref Range Status   Specimen Description JOINT FLUID RIGHT  Final  Special Requests   Final    OLECRANON BURSA PATIENT ON FOLLOWING  ANCEF AND VANCOMYCIN   Culture   Final    NO ANAEROBES ISOLATED; CULTURE IN PROGRESS FOR 5 DAYS   Report Status PENDING  Incomplete  Body fluid culture     Status: None (Preliminary result)   Collection Time: 04/26/16  8:08 AM  Result Value Ref Range Status   Specimen Description JOINT FLUID RIGHT  Final   Special Requests   Final    OLECRANON BURSA PATIENT ON FOLLOWING  ANCEF AND VANCOMYCIN   Gram Stain   Final    FEW WBC PRESENT, PREDOMINANTLY PMN NO ORGANISMS SEEN    Culture NO  GROWTH 1 DAY  Final   Report Status PENDING  Incomplete     Time coordinating discharge: Less than 30 minutes  SIGNED:   Katrinka BlazingAlex U Kadolph, MD  [facility] 04/27/2016, 4:40 PM Pager [phone] If 7PM-7AM, please contact night-coverage [email or web address] Password [email or web address]

## 2016-04-27 NOTE — Progress Notes (Signed)
Pt is anxious to go home, ambulating in the hall. Right elbow dressing dry and intact, pain is controlled w/ oral narcotics.  Discharge instructions given to pt, verbalized understanding. Discharged home accompanied by family.

## 2016-04-27 NOTE — Progress Notes (Signed)
   Assessment: 1 Day Post-Op  S/P Procedure(s) (LRB): IRRIGATION AND DEBRIDEMENT RIGHT ELBOW WITH BURSECTOMY (Right) by Dr. Jewel Baizeimothy D. Murphy on 04/26/16  Principal Problem:   Right arm cellulitis Active Problems:   Acute kidney injury (HCC)   Septic olecranon bursitis of right elbow  Stable for discharge from an orthopedic perspective. AFVSN.  Feeling better.  Less pain.  WBC wnl.  Eating, drinking, voiding.   Plan: Follow up in the office on Monday for re-evaluation and removal of penrose drain. PO antibiotics prescribed for 2 weeks.   Continue to follow cultures Elevate arm, apply ice Sling for comfort Keep dressings in place and dry  Weight Bearing: As tolerated, but limit strenuous use of right arm.  Do not bend arm past 90 degrees.  Discussed with the patient. Dressings: reinforce prn.  VTE prophylaxis: SCDs, ambulation Dispo: Home  Subjective: Patient reports pain as mild to moderate, improving. Pain controlled with PO meds.  Tolerating diet.  Urinating.  +Flatus.  No CP, SOB.    Objective:   VITALS:   Vitals:   04/26/16 1734 04/26/16 2134 04/27/16 0214 04/27/16 0539  BP: (!) 141/90 134/80 (!) 145/82 131/81  Pulse: 86 81 82 89  Resp: 16 16 16 15   Temp: 98.9 F (37.2 C) 97.8 F (36.6 C) 98.6 F (37 C) 98.2 F (36.8 C)  TempSrc: Oral Oral Oral Oral  SpO2: 95% 96% 96% 96%  Weight:      Height:       CBC Latest Ref Rng & Units 04/26/2016 04/25/2016 04/24/2016  WBC 4.0 - 10.5 K/uL 7.3 7.1 7.5  Hemoglobin 13.0 - 17.0 g/dL 12.3(L) 12.4(L) 11.9(L)  Hematocrit 39.0 - 52.0 % 38.4(L) 37.8(L) 37.3(L)  Platelets 150 - 400 K/uL 246 233 221   BMP Latest Ref Rng & Units 04/26/2016 04/25/2016 04/24/2016  Glucose 65 - 99 mg/dL 409(W107(H) 119(J112(H) 478(G112(H)  BUN 6 - 20 mg/dL 12 12 12   Creatinine 0.61 - 1.24 mg/dL 9.561.19 2.131.20 0.861.20  Sodium 135 - 145 mmol/L 138 139 137  Potassium 3.5 - 5.1 mmol/L 4.5 4.3 3.9  Chloride 101 - 111 mmol/L 102 105 102  CO2 22 - 32 mmol/L 27 26 24     Calcium 8.9 - 10.3 mg/dL 9.0 9.1 5.7(Q8.5(L)   Intake/Output      12/14 0701 - 12/15 0700   P.O. 620   I.V. (mL/kg) 1000 (10)   Total Intake(mL/kg) 1620 (16.2)   Blood 15   Total Output 15   Net +1605         Physical Exam: General: NAD.  Supine in bed Resp: No increased wob Cardio: regular rate and rhythm ABD soft Neurologically intact MSK Right UE: Neurovascularly intact Sensation intact distally Intact pulses distally Incision: dressing C/D/I  Carlos BilletHenry Calvin Carlos Yates Carlos Yates 04/27/2016, 6:52 AM

## 2016-04-28 LAB — CULTURE, BLOOD (ROUTINE X 2)
CULTURE: NO GROWTH
CULTURE: NO GROWTH

## 2016-04-30 LAB — BODY FLUID CULTURE: CULTURE: NO GROWTH

## 2016-05-01 LAB — ANAEROBIC CULTURE

## 2016-05-01 NOTE — Addendum Note (Signed)
Addendum  created 05/01/16 1357 by Cristela BlueKyle Denelda Akerley, MD   Sign clinical note

## 2016-09-24 DIAGNOSIS — J209 Acute bronchitis, unspecified: Secondary | ICD-10-CM | POA: Diagnosis not present

## 2016-09-24 DIAGNOSIS — R05 Cough: Secondary | ICD-10-CM | POA: Diagnosis not present

## 2016-09-24 DIAGNOSIS — J9801 Acute bronchospasm: Secondary | ICD-10-CM | POA: Diagnosis not present

## 2017-01-05 DIAGNOSIS — M25511 Pain in right shoulder: Secondary | ICD-10-CM | POA: Diagnosis not present

## 2018-09-14 IMAGING — CT CT EXTREM UP ENTIRE ARM*R* W/ CM
3 of 5 series · 8 of 33 positions shown, 9 images · IV contrast (agent unspecified)
Comparison: Forearm radiographs dated 04/23/2016 low

CONTRAST:  100mL PH3B4I-UVV IOPAMIDOL (PH3B4I-UVV) INJECTION 61%

CLINICAL DATA: Cellulitis.

EXAM:
CT OF THE UPPER RIGHT EXTREMITY WITH CONTRAST
TECHNIQUE: Multidetector CT imaging of the upper right extremity was performed
according to the standard protocol following intravenous contrast
administration.

[Series 3: 2 1.5 mm st ax · axial · 0.55mm/px · z∈[+842,+1232]mm · 2 of 565 slices shown, 3 images]
[im 174/565  soft-tissue]
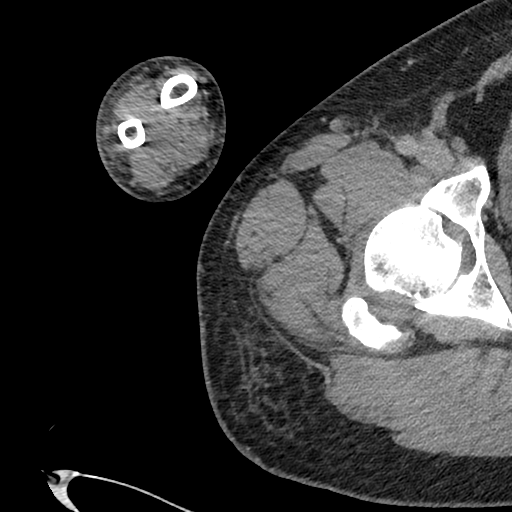
[im 174/565  bone]
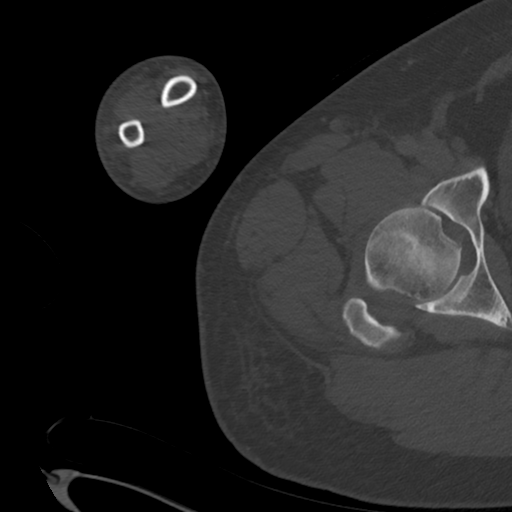
[im 434/565  bone]
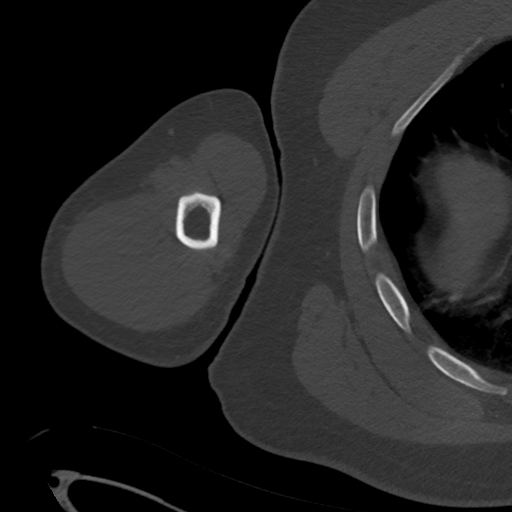

[Series 6: cor 1.5 mm bone · coronal · 0.57mm/px · 1 of 158 slices shown]
[im 79/158  bone]
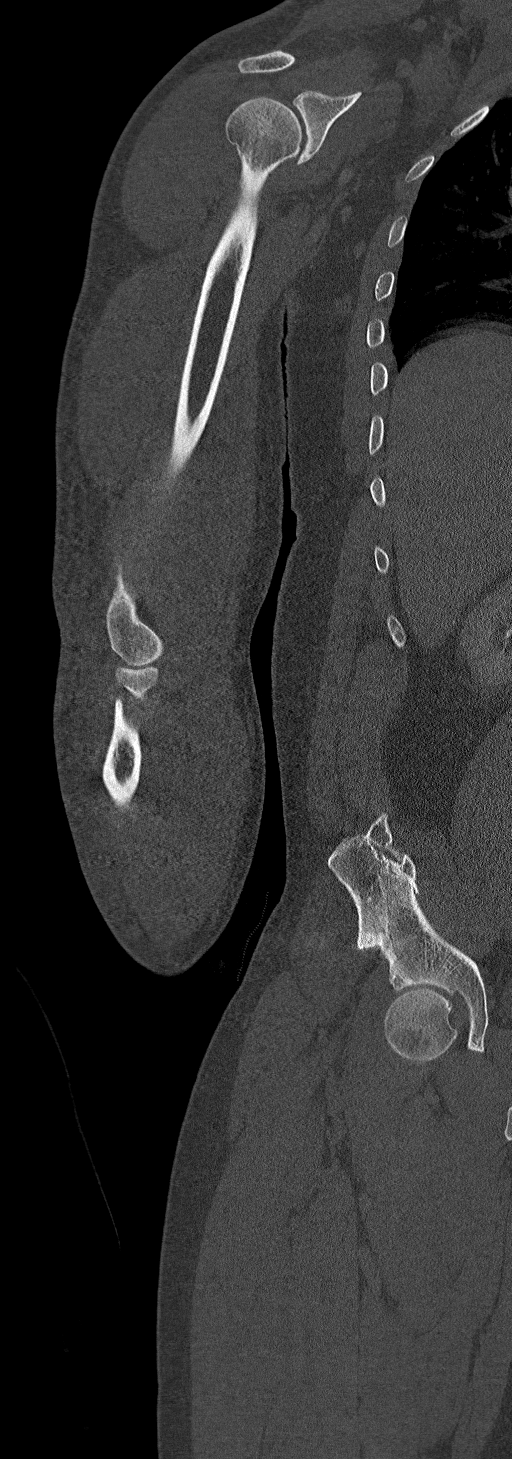

[Series 9: sag 1.5 mm st · sagittal · 0.58mm/px · 5 of 157 slices shown]
[im 27/157  bone]
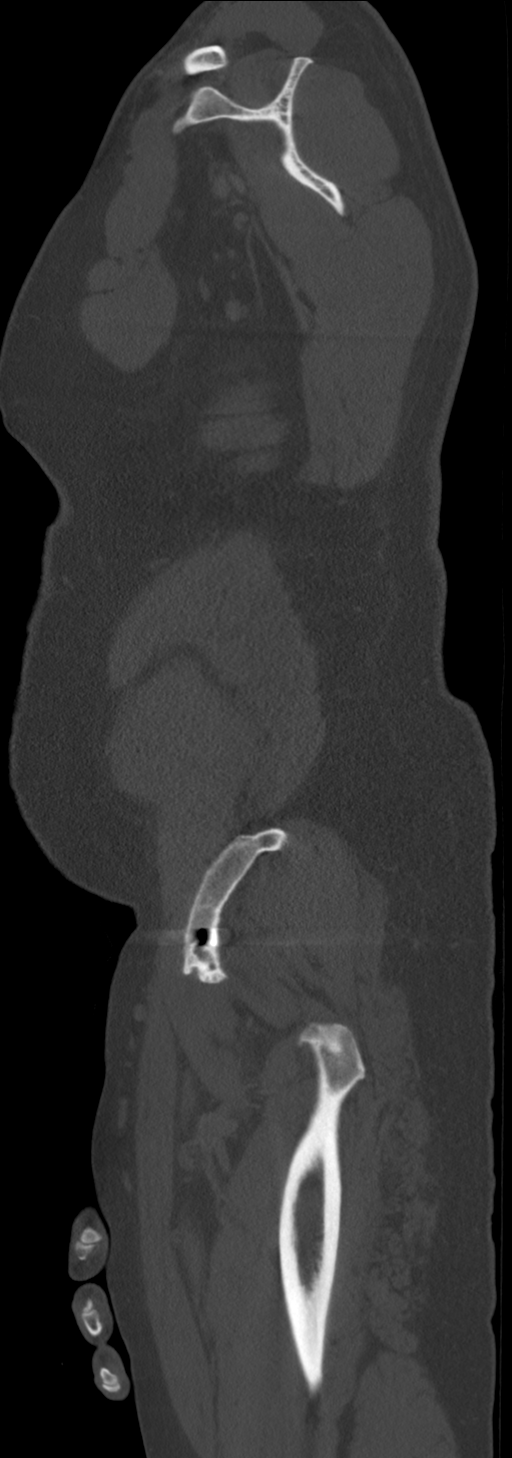
[im 53/157  bone]
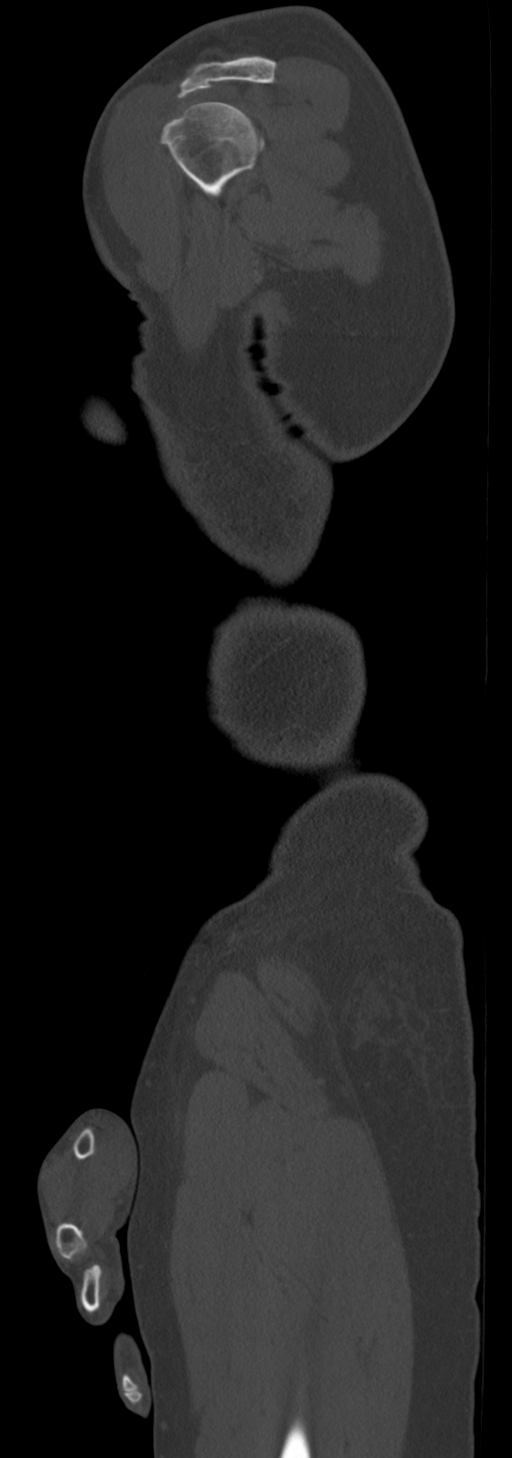
[im 79/157  bone]
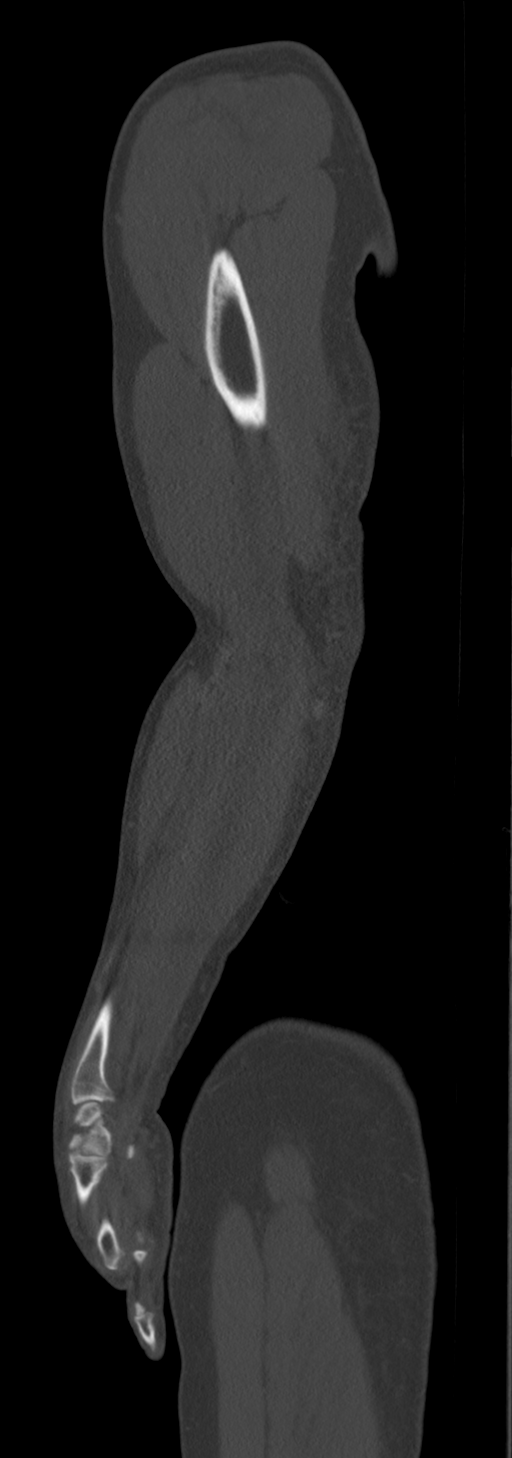
[im 105/157  bone]
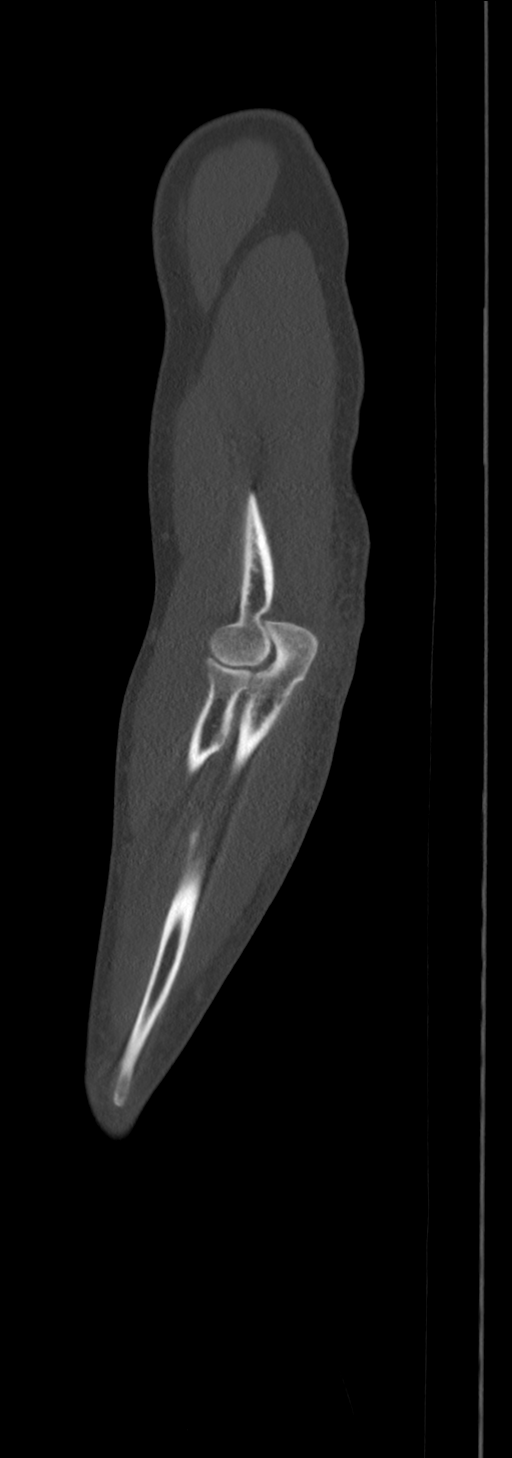
[im 131/157  bone]
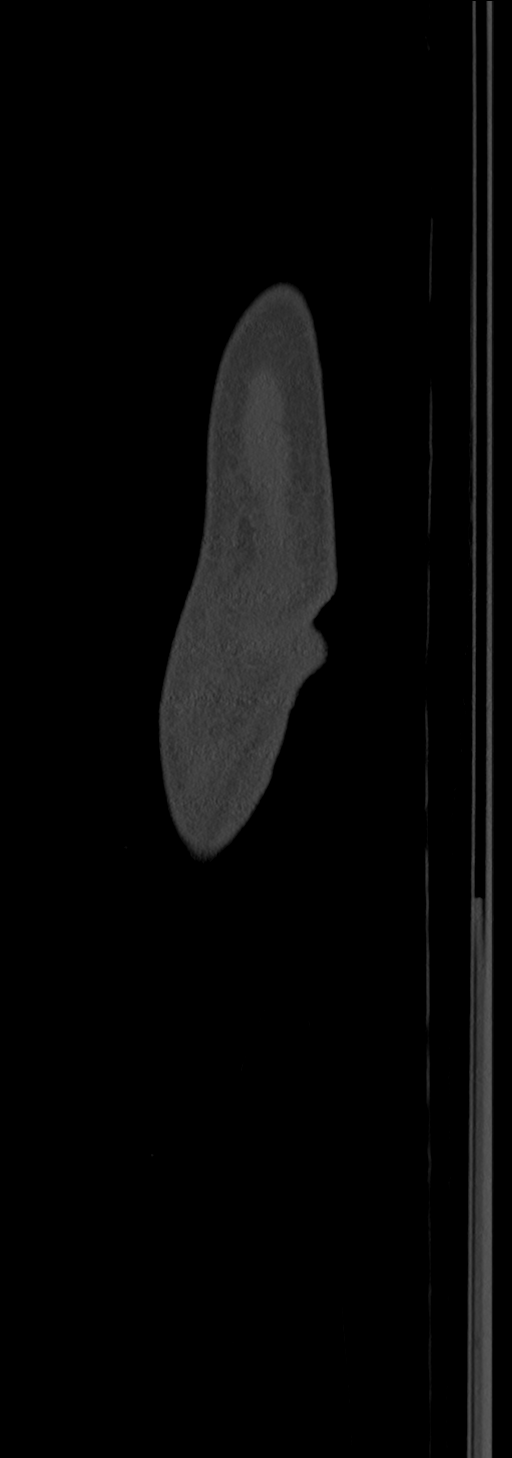

[8 of 33 positions shown; findings below may reference images not displayed]

FINDINGS: Bones/Joint/Cartilage

Negative. No joint effusion.

Ligaments

Suboptimally assessed by CT.

Muscles and Tendons

Negative

Soft tissues

There is fluid distending the olecranon bursa with soft tissue edema
in the adjacent subcutaneous fat extending proximally and distally
from the bursa. The edema extends [DATE] of the way down the forearm.
IMPRESSION: Findings consistent with olecranon bursitis. Given the patient's
clinical history, the finding is worrisome for infected olecranon
bursitis.
